# Patient Record
Sex: Female | Born: 1975
Health system: Southern US, Community
[De-identification: ages and names within clinical notes are randomized; demographics above are authoritative.]

## PROBLEM LIST (undated history)

## (undated) DIAGNOSIS — Z8659 Personal history of other mental and behavioral disorders: Secondary | ICD-10-CM

## (undated) DIAGNOSIS — K219 Gastro-esophageal reflux disease without esophagitis: Secondary | ICD-10-CM

## (undated) DIAGNOSIS — F32A Depression, unspecified: Secondary | ICD-10-CM

## (undated) DIAGNOSIS — J302 Other seasonal allergic rhinitis: Secondary | ICD-10-CM

## (undated) DIAGNOSIS — N200 Calculus of kidney: Secondary | ICD-10-CM

## (undated) DIAGNOSIS — Z8619 Personal history of other infectious and parasitic diseases: Secondary | ICD-10-CM

## (undated) HISTORY — DX: Other seasonal allergic rhinitis: J30.2

## (undated) HISTORY — DX: Gastro-esophageal reflux disease without esophagitis: K21.9

## (undated) HISTORY — DX: Calculus of kidney: N20.0

## (undated) HISTORY — DX: Depression, unspecified: F32.A

## (undated) HISTORY — DX: Personal history of other mental and behavioral disorders: Z86.59

## (undated) HISTORY — DX: Personal history of other infectious and parasitic diseases: Z86.19

---

## 1998-10-08 ENCOUNTER — Emergency Department (HOSPITAL_COMMUNITY): Admission: EM | Admit: 1998-10-08 | Discharge: 1998-10-08 | Payer: Self-pay | Admitting: Emergency Medicine

## 1999-02-12 ENCOUNTER — Emergency Department (HOSPITAL_COMMUNITY): Admission: EM | Admit: 1999-02-12 | Discharge: 1999-02-12 | Payer: Self-pay | Admitting: *Deleted

## 1999-08-30 ENCOUNTER — Other Ambulatory Visit: Admission: RE | Admit: 1999-08-30 | Discharge: 1999-08-30 | Payer: Self-pay | Admitting: Family Medicine

## 2000-05-01 ENCOUNTER — Other Ambulatory Visit: Admission: RE | Admit: 2000-05-01 | Discharge: 2000-05-01 | Payer: Self-pay | Admitting: Family Medicine

## 2000-11-27 ENCOUNTER — Other Ambulatory Visit: Admission: RE | Admit: 2000-11-27 | Discharge: 2000-11-27 | Payer: Self-pay | Admitting: Unknown Physician Specialty

## 2000-11-27 ENCOUNTER — Other Ambulatory Visit: Admission: RE | Admit: 2000-11-27 | Discharge: 2000-11-27 | Payer: Self-pay | Admitting: Family Medicine

## 2001-02-20 ENCOUNTER — Other Ambulatory Visit: Admission: RE | Admit: 2001-02-20 | Discharge: 2001-02-20 | Payer: Self-pay | Admitting: Family Medicine

## 2004-08-14 ENCOUNTER — Other Ambulatory Visit: Admission: RE | Admit: 2004-08-14 | Discharge: 2004-08-14 | Payer: Self-pay | Admitting: Family Medicine

## 2004-09-18 ENCOUNTER — Ambulatory Visit (HOSPITAL_COMMUNITY): Admission: RE | Admit: 2004-09-18 | Discharge: 2004-09-18 | Payer: Self-pay | Admitting: Family Medicine

## 2005-12-13 ENCOUNTER — Other Ambulatory Visit: Admission: RE | Admit: 2005-12-13 | Discharge: 2005-12-13 | Payer: Self-pay | Admitting: Family Medicine

## 2005-12-20 LAB — HM PAP SMEAR

## 2009-12-07 ENCOUNTER — Inpatient Hospital Stay (HOSPITAL_COMMUNITY): Admission: AD | Admit: 2009-12-07 | Discharge: 2009-12-07 | Payer: Self-pay | Admitting: Obstetrics and Gynecology

## 2010-07-10 ENCOUNTER — Inpatient Hospital Stay (HOSPITAL_COMMUNITY): Admission: AD | Admit: 2010-07-10 | Discharge: 2010-07-13 | Payer: Self-pay | Admitting: Obstetrics and Gynecology

## 2010-11-11 ENCOUNTER — Encounter: Payer: Self-pay | Admitting: Family Medicine

## 2011-01-04 LAB — CBC
HCT: 31.4 % — ABNORMAL LOW (ref 36.0–46.0)
HCT: 33.2 % — ABNORMAL LOW (ref 36.0–46.0)
Hemoglobin: 10.7 g/dL — ABNORMAL LOW (ref 12.0–15.0)
Hemoglobin: 11.3 g/dL — ABNORMAL LOW (ref 12.0–15.0)
MCH: 30.4 pg (ref 26.0–34.0)
MCH: 30.6 pg (ref 26.0–34.0)
MCHC: 34 g/dL (ref 30.0–36.0)
MCHC: 34.1 g/dL (ref 30.0–36.0)
MCV: 89.5 fL (ref 78.0–100.0)
MCV: 89.8 fL (ref 78.0–100.0)
Platelets: 175 10*3/uL (ref 150–400)
Platelets: 183 10*3/uL (ref 150–400)
RBC: 3.49 MIL/uL — ABNORMAL LOW (ref 3.87–5.11)
RBC: 3.71 MIL/uL — ABNORMAL LOW (ref 3.87–5.11)
RDW: 13.4 % (ref 11.5–15.5)
RDW: 13.7 % (ref 11.5–15.5)
WBC: 11.4 10*3/uL — ABNORMAL HIGH (ref 4.0–10.5)
WBC: 12.8 10*3/uL — ABNORMAL HIGH (ref 4.0–10.5)

## 2011-01-04 LAB — RPR: RPR Ser Ql: NONREACTIVE

## 2011-01-11 LAB — CBC
HCT: 35.3 % — ABNORMAL LOW (ref 36.0–46.0)
Hemoglobin: 12 g/dL (ref 12.0–15.0)
MCHC: 34 g/dL (ref 30.0–36.0)
MCV: 88 fL (ref 78.0–100.0)
Platelets: 238 10*3/uL (ref 150–400)
RBC: 4.01 MIL/uL (ref 3.87–5.11)
RDW: 12.3 % (ref 11.5–15.5)
WBC: 10.9 10*3/uL — ABNORMAL HIGH (ref 4.0–10.5)

## 2011-01-11 LAB — ABO/RH: ABO/RH(D): O POS

## 2011-05-01 ENCOUNTER — Encounter: Payer: Self-pay | Admitting: Family Medicine

## 2011-05-01 DIAGNOSIS — J302 Other seasonal allergic rhinitis: Secondary | ICD-10-CM | POA: Insufficient documentation

## 2011-05-01 DIAGNOSIS — F329 Major depressive disorder, single episode, unspecified: Secondary | ICD-10-CM | POA: Insufficient documentation

## 2012-10-22 HISTORY — PX: DOBUTAMINE STRESS ECHO: SHX5426

## 2013-01-07 ENCOUNTER — Observation Stay (HOSPITAL_COMMUNITY)
Admission: EM | Admit: 2013-01-07 | Discharge: 2013-01-07 | Disposition: A | Discharge status: Home / Self Care | Payer: 59 | Attending: Internal Medicine | Admitting: Internal Medicine

## 2013-01-07 ENCOUNTER — Emergency Department (HOSPITAL_COMMUNITY): Payer: 59

## 2013-01-07 ENCOUNTER — Encounter (HOSPITAL_COMMUNITY): Payer: Self-pay | Admitting: Adult Health

## 2013-01-07 DIAGNOSIS — R42 Dizziness and giddiness: Secondary | ICD-10-CM | POA: Insufficient documentation

## 2013-01-07 DIAGNOSIS — R072 Precordial pain: Secondary | ICD-10-CM

## 2013-01-07 DIAGNOSIS — E876 Hypokalemia: Secondary | ICD-10-CM | POA: Diagnosis present

## 2013-01-07 DIAGNOSIS — Z201 Contact with and (suspected) exposure to tuberculosis: Secondary | ICD-10-CM | POA: Insufficient documentation

## 2013-01-07 DIAGNOSIS — R079 Chest pain, unspecified: Principal | ICD-10-CM | POA: Insufficient documentation

## 2013-01-07 DIAGNOSIS — R0789 Other chest pain: Secondary | ICD-10-CM | POA: Diagnosis present

## 2013-01-07 DIAGNOSIS — R0602 Shortness of breath: Secondary | ICD-10-CM | POA: Insufficient documentation

## 2013-01-07 DIAGNOSIS — D72829 Elevated white blood cell count, unspecified: Secondary | ICD-10-CM | POA: Diagnosis present

## 2013-01-07 LAB — BASIC METABOLIC PANEL
BUN: 15 mg/dL (ref 6–23)
CO2: 26 mEq/L (ref 19–32)
Calcium: 9.2 mg/dL (ref 8.4–10.5)
GFR calc Af Amer: >90 mL/min (ref >90)
GFR calc non Af Amer: >90 mL/min (ref >90)
Glucose, Bld: 99 mg/dL (ref 70–99)
Potassium: 3.4 mEq/L — ABNORMAL LOW (ref 3.5–5.1)
Sodium: 138 mEq/L (ref 135–145)

## 2013-01-07 LAB — CBC WITH DIFFERENTIAL/PLATELET
Eosinophils Relative: 1 % (ref 0–5)
HCT: 37.9 % (ref 36.0–46.0)
Hemoglobin: 13 g/dL (ref 12.0–15.0)
Lymphocytes Relative: 17 % (ref 12–46)
Lymphs Abs: 2 10*3/uL (ref 0.7–4.0)
MCHC: 34.3 g/dL (ref 30.0–36.0)
MCV: 85 fL (ref 78.0–100.0)
Monocytes Absolute: 0.4 10*3/uL (ref 0.1–1.0)
Monocytes Relative: 4 % (ref 3–12)
Neutrophils Relative %: 78 % — ABNORMAL HIGH (ref 43–77)
Platelets: 209 10*3/uL (ref 150–400)
RBC: 4.46 MIL/uL (ref 3.87–5.11)
RDW: 12.3 % (ref 11.5–15.5)
WBC: 11.8 10*3/uL — ABNORMAL HIGH (ref 4.0–10.5)

## 2013-01-07 LAB — POCT I-STAT TROPONIN I
Troponin i, poc: 0 ng/mL (ref 0.00–0.08)
Troponin i, poc: 0 ng/mL (ref 0.00–0.08)

## 2013-01-07 LAB — CK TOTAL AND CKMB (NOT AT ARMC)
CK, MB: 2.3 ng/mL (ref 0.3–4.0)
Total CK: 92 U/L (ref 7–177)

## 2013-01-07 LAB — TROPONIN I: Troponin I: <0.3 ng/mL (ref <0.30)

## 2013-01-07 MED ORDER — CALCIUM CARBONATE ANTACID 500 MG PO CHEW
1.0000 | CHEWABLE_TABLET | Freq: Three times a day (TID) | ORAL | Status: DC
  Administered 2013-01-07: 200 mg via ORAL
  Filled 2013-01-07 (×3): qty 1

## 2013-01-07 MED ORDER — GI COCKTAIL ~~LOC~~
30.0000 mL | Freq: Once | ORAL | Status: AC
  Administered 2013-01-07: 30 mL via ORAL
  Filled 2013-01-07: qty 30

## 2013-01-07 MED ORDER — SODIUM CHLORIDE 0.9 % IV SOLN: INTRAVENOUS | Status: DC

## 2013-01-07 MED ORDER — POTASSIUM CHLORIDE CRYS ER 20 MEQ PO TBCR
40.0000 meq | EXTENDED_RELEASE_TABLET | Freq: Once | ORAL | Status: AC
  Administered 2013-01-07: 40 meq via ORAL
  Filled 2013-01-07: qty 2

## 2013-01-07 MED ORDER — POTASSIUM CHLORIDE 10 MEQ/100ML IV SOLN
10.0000 meq | INTRAVENOUS | Status: DC
  Filled 2013-01-07 (×2): qty 100

## 2013-01-07 MED ORDER — SODIUM CHLORIDE 0.9 % IV SOLN
INTRAVENOUS | Status: DC
  Administered 2013-01-07: 06:00:00 via INTRAVENOUS

## 2013-01-07 MED ORDER — FENTANYL CITRATE 0.05 MG/ML IJ SOLN
100.0000 ug | Freq: Once | INTRAMUSCULAR | Status: AC
  Administered 2013-01-07: 100 ug via INTRAVENOUS
  Filled 2013-01-07: qty 2

## 2013-01-07 MED ORDER — ONDANSETRON HCL 4 MG/2ML IJ SOLN: 4.0000 mg | Freq: Four times a day (QID) | INTRAMUSCULAR | Status: DC | PRN

## 2013-01-07 MED ORDER — IOHEXOL 350 MG/ML SOLN
100.0000 mL | Freq: Once | INTRAVENOUS | Status: AC | PRN
  Administered 2013-01-07: 85 mL via INTRAVENOUS

## 2013-01-07 MED ORDER — ALUM & MAG HYDROXIDE-SIMETH 200-200-20 MG/5ML PO SUSP: 30.0000 mL | Freq: Four times a day (QID) | ORAL | Status: DC | PRN

## 2013-01-07 MED ORDER — ACETAMINOPHEN 650 MG RE SUPP: 650.0000 mg | Freq: Four times a day (QID) | RECTAL | Status: DC | PRN

## 2013-01-07 MED ORDER — ONDANSETRON HCL 4 MG/2ML IJ SOLN
4.0000 mg | Freq: Once | INTRAMUSCULAR | Status: AC
  Administered 2013-01-07: 4 mg via INTRAVENOUS
  Filled 2013-01-07: qty 2

## 2013-01-07 MED ORDER — ACETAMINOPHEN 325 MG PO TABS: 650.0000 mg | ORAL_TABLET | Freq: Four times a day (QID) | ORAL | Status: DC | PRN

## 2013-01-07 MED ORDER — PANTOPRAZOLE SODIUM 40 MG PO TBEC: 40.0000 mg | DELAYED_RELEASE_TABLET | Freq: Every day | ORAL | Status: DC

## 2013-01-07 MED ORDER — PANTOPRAZOLE SODIUM 40 MG IV SOLR
40.0000 mg | Freq: Once | INTRAVENOUS | Status: AC
  Administered 2013-01-07: 40 mg via INTRAVENOUS
  Filled 2013-01-07: qty 40

## 2013-01-07 MED ORDER — PANTOPRAZOLE SODIUM 40 MG IV SOLR
40.0000 mg | Freq: Two times a day (BID) | INTRAVENOUS | Status: DC
  Administered 2013-01-07: 40 mg via INTRAVENOUS
  Filled 2013-01-07 (×2): qty 40

## 2013-01-07 MED ORDER — HYDROCODONE-ACETAMINOPHEN 5-325 MG PO TABS: 1.0000 | ORAL_TABLET | ORAL | Status: DC | PRN

## 2013-01-07 MED ORDER — HYDROMORPHONE HCL PF 1 MG/ML IJ SOLN: 1.0000 mg | INTRAMUSCULAR | Status: DC | PRN

## 2013-01-07 MED ORDER — SODIUM CHLORIDE 0.9 % IJ SOLN: 3.0000 mL | Freq: Two times a day (BID) | INTRAMUSCULAR | Status: DC

## 2013-01-07 MED ORDER — TRAMADOL HCL 50 MG PO TABS: 50.0000 mg | ORAL_TABLET | Freq: Four times a day (QID) | ORAL | Status: DC | PRN

## 2013-01-07 MED ORDER — ONDANSETRON HCL 4 MG PO TABS: 4.0000 mg | ORAL_TABLET | Freq: Four times a day (QID) | ORAL | Status: DC | PRN

## 2013-01-07 NOTE — ED Provider Notes (Signed)
History     CSN: [id number]  Arrival date & time 01/07/13  0150   First MD Initiated Contact with Patient 01/07/13 0231      Chief Complaint  Patient presents with  . Chest Pain    (Consider location/radiation/quality/duration/timing/severity/associated sxs/prior treatment) HPI This is a 37 year old female without significant past medical history. She had the sudden onset of chest pain about 11 PM yesterday evening. The pain is described as crushing and severe. It is located all across her chest without focality or radiation. The pain is not worse with deep breaths. She is not short of breath but is hesitant to take a breath because of the presence of the pain. She took antacids with no relief. The pain is not worse with movement. She has some nausea but no vomiting. She denies abdominal pain. She denies leg pain or swelling. She denies recent prolonged immobilization. She is on birth control pills.  Past Medical History  Diagnosis Date  . Depression   . Seasonal allergies     History reviewed. No pertinent past surgical history.  History reviewed. No pertinent family history.  History  Substance Use Topics  . Smoking status: Not on file  . Smokeless tobacco: Not on file  . Alcohol Use: Not on file    OB History   Grav Para Term Preterm Abortions TAB SAB Ect Mult Living                  Review of Systems  All other systems reviewed and are negative.    Allergies  Review of patient's allergies indicates no known allergies.  Home Medications   Current Outpatient Rx  Name  Route  Sig  Dispense  Refill  . calcium carbonate (TUMS - DOSED IN MG ELEMENTAL CALCIUM) 500 MG chewable tablet   Oral   Chew 1 tablet by mouth 3 (three) times daily.         Marland Kitchen ibuprofen (ADVIL,MOTRIN) 200 MG tablet   Oral   Take 200 mg by mouth every 6 (six) hours as needed for pain.           BP 98/63  Pulse 69  Temp(Src) 97.8 F (36.6 C) (Oral)  Resp 11  SpO2 97%  Physical  Exam General: Well-developed, well-nourished female in no acute distress; appearance consistent with age of record HENT: normocephalic, atraumatic Eyes: pupils equal round and reactive to light; extraocular muscles intact Neck: supple Heart: regular rate and rhythm; no murmurs, rubs or gallops Lungs: clear to auscultation bilaterally Abdomen: soft; nondistended; nontender; no masses or hepatosplenomegaly; bowel sounds present Extremities: No deformity; full range of motion; pulses normal; no calf tenderness; no edema Neurologic: Awake, alert and oriented; motor function intact in all extremities and symmetric; no facial droop Skin: Warm and dry Psychiatric: Flat affect    ED Course  Procedures (including critical care time)     MDM   Nursing notes and vitals signs, including pulse oximetry, reviewed.  Summary of this visit's results, reviewed by myself:  Labs:  Results for orders placed during the hospital encounter of 01/07/13 (from the past 24 hour(s))  CBC WITH DIFFERENTIAL     Status: Abnormal   Collection Time    01/07/13  2:01 AM      Result Value Range   WBC 11.8 (*) 4.0 - 10.5 K/uL   RBC 4.46  3.87 - 5.11 MIL/uL   Hemoglobin 13.0  12.0 - 15.0 g/dL   HCT 40.9  81.1 - 91.4 %  MCV 85.0  78.0 - 100.0 fL   MCH 29.1  26.0 - 34.0 pg   MCHC 34.3  30.0 - 36.0 g/dL   RDW 16.1  09.6 - 04.5 %   Platelets 209  150 - 400 K/uL   Neutrophils Relative 78 (*) 43 - 77 %   Neutro Abs 9.2 (*) 1.7 - 7.7 K/uL   Lymphocytes Relative 17  12 - 46 %   Lymphs Abs 2.0  0.7 - 4.0 K/uL   Monocytes Relative 4  3 - 12 %   Monocytes Absolute 0.4  0.1 - 1.0 K/uL   Eosinophils Relative 1  0 - 5 %   Eosinophils Absolute 0.2  0.0 - 0.7 K/uL   Basophils Relative 0  0 - 1 %   Basophils Absolute 0.0  0.0 - 0.1 K/uL  BASIC METABOLIC PANEL     Status: Abnormal   Collection Time    01/07/13  2:01 AM      Result Value Range   Sodium 138  135 - 145 mEq/L   Potassium 3.4 (*) 3.5 - 5.1 mEq/L    Chloride 103  96 - 112 mEq/L   CO2 26  19 - 32 mEq/L   Glucose, Bld 99  70 - 99 mg/dL   BUN 15  6 - 23 mg/dL   Creatinine, Ser 4.09  0.50 - 1.10 mg/dL   Calcium 9.2  8.4 - 81.1 mg/dL   GFR calc non Af Amer >90  >90 mL/min   GFR calc Af Amer >90  >90 mL/min  POCT I-STAT TROPONIN I     Status: None   Collection Time    01/07/13  2:15 AM      Result Value Range   Troponin i, poc 0.00  0.00 - 0.08 ng/mL   Comment 3           POCT I-STAT TROPONIN I     Status: None   Collection Time    01/07/13  5:32 AM      Result Value Range   Troponin i, poc 0.00  0.00 - 0.08 ng/mL   Comment 3             Imaging Studies: Dg Chest 2 View  01/07/2013  *RADIOLOGY REPORT*  Clinical Data: Sudden onset generalized chest pain and dizziness.  CHEST - 2 VIEW  Comparison: None.  Findings: The heart size and pulmonary vascularity are normal. The lungs appear clear and expanded without focal air space disease or consolidation. No blunting of the costophrenic angles.  No pneumothorax.  Mediastinal contours appear intact.  IMPRESSION: No evidence of active pulmonary disease.   Original Report Authenticated By: Burman Nieves, M.D.    Ct Angio Chest Pe W/cm &/or Wo Cm  01/07/2013  *RADIOLOGY REPORT*  Clinical Data: Crushing bilateral upper chest pain.  Shortness of breath.  History of TB exposure with and 2 months.  CT ANGIOGRAPHY CHEST  Technique:  Multidetector CT imaging of the chest using the standard protocol during bolus administration of intravenous contrast. Multiplanar reconstructed images including MIPs were obtained and reviewed to evaluate the vascular anatomy.  Contrast: 85mL OMNIPAQUE IOHEXOL 350 MG/ML SOLN  Comparison: None.  Findings: Technically adequate study with good opacification of the central and segmental pulmonary arteries.  No focal filling defects.  No evidence of significant pulmonary embolus.  Normal heart size.  Normal caliber thoracic aorta.  No significant lymphadenopathy in the chest.   The esophagus is decompressed.  No abnormal mediastinal  fluid collections.  The lungs appear clear expanded except for minimal dependent changes.  No focal consolidation or airspace disease.  No significant interstitial change.  No parenchymal changes that would suggest active TB. Airways appear patent.  No pleural effusions.  No pneumothorax.  Visualized portions of the upper abdominal organs are grossly unremarkable.  Normal alignment of the thoracic vertebrae.  IMPRESSION: No evidence of significant pulmonary embolus.  No evidence of active pulmonary disease.   Original Report Authenticated By: Burman Nieves, M.D.      Date: 01/07/2013 1:55 AM  Rate: 69  Rhythm: normal sinus rhythm  QRS Axis: normal  Intervals: normal  ST/T Wave abnormalities: normal  Conduction Disutrbances: none  Narrative Interpretation: unremarkable  Comparison with previous EKG: None available  3:25 AM Complete relief with IV Zofran and fentanyl.  5:16 AM Patient's pain is returned. Not relieved by GI cocktail. Pain improved before an EKG could be obtained. An EKG was obtained pain was nearly gone; the EKG was normal and unchanged from prior.  6:52 AM Patient asymptomatic at this time but has had several episodes. This is concerning for Prinzmetal's angina and will have her admitted for further evaluation.          Hanley Seamen, MD 01/07/13 [phone]

## 2013-01-07 NOTE — Progress Notes (Addendum)
Spoke with Sarah Mcdaniel in echo lab. Pt scheduled for stress echo at 3pm today. Pt may have full liquid diet (NO SOLIDS) until 11am. [name], RN  Verified with Dr. Eldridge Dace that patient can have liquid breakfast. Keep NPO at 11am. [name], RN

## 2013-01-07 NOTE — ED Notes (Signed)
Pt ambulated to restroom. 

## 2013-01-07 NOTE — Progress Notes (Signed)
  Echocardiogram Echocardiogram Stress Test has been performed.  Cathie Beams 01/07/2013, 4:46 PM

## 2013-01-07 NOTE — ED Notes (Signed)
Pt placed back on cardiac monitor.

## 2013-01-07 NOTE — H&P (Signed)
History and Physical       Hospital Admission Note Date: 01/07/2013  Patient name: Sarah Mcdaniel Medical record number: [id number] Date of birth: 06/28/1976 Age: 37 y.o. 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 History was obtained from the patient who stated that she was sitting up and watching TV, when she developed sudden crushing and severe chest pain, diffusely all over the chest. There was no radiation or focality, no palpitations or diaphoresis. Patient took TUMS but no relief. No worsening of the chest pain with deep breathing or movement. She did have some nausea but no vomiting or any abdominal pain. She denies any history of GERD. She has been taking NSAIDs PRN for headaches but not regularly. She denies any long-distance flights or prolonged immobilization. She is on birth control pills.  Patient also reports that her father had similar episodes of chest pain but did not have a diagnosis, she has several family members with "heart disease". EKG showed normal sinus rhythm with no acute ST-T wave changes, first set of troponins negative. CT angiogram of the chest was done in ED and showed no acute pulmonary embolus or any active pulmonary disease, normal caliber thoracic aorta. At the time of my encounter, chest pain had improved with fentanyl.  Review of Systems:  Constitutional: Denies fever, chills, diaphoresis, appetite change and fatigue.  HEENT: Denies photophobia, eye pain, redness, hearing loss, ear pain, congestion, sore throat, rhinorrhea, sneezing, mouth sores, trouble swallowing, neck pain, neck stiffness and tinnitus.   Respiratory: Denies SOB, DOE, cough,  and wheezing.   Cardiovascular: See history of present  illness Gastrointestinal: Denies vomiting, abdominal pain, diarrhea, constipation, blood in stool and abdominal distention.  Genitourinary: Denies dysuria, urgency, frequency, hematuria, flank pain and difficulty urinating.  Musculoskeletal: Denies myalgias, back pain, joint swelling, arthralgias and gait problem.  Skin: Denies pallor, rash and wound.  Neurological: Denies dizziness, seizures, syncope, weakness, light-headedness, numbness and headaches.  Hematological: Denies adenopathy. Easy bruising, personal or family bleeding history  Psychiatric/Behavioral: Denies suicidal ideation, mood changes, confusion, nervousness, sleep disturbance and agitation  Past Medical History: Past Medical History  Diagnosis Date  . Depression   . Seasonal allergies    History reviewed. No pertinent past surgical history.  Medications: Prior to Admission medications   Medication Sig Start Date End Date Taking? Authorizing Provider  calcium carbonate (TUMS - DOSED IN MG ELEMENTAL CALCIUM) 500 MG chewable tablet Chew 1 tablet by mouth 3 (three) times daily.   Yes Historical Provider, MD  ibuprofen (ADVIL,MOTRIN) 200 MG tablet Take 200 mg by mouth every 6 (six) hours as needed for pain.   Yes Historical Provider, MD    Allergies:  No Known Allergies  Social History: Denies any tobacco, and drugs. Drinks alcohol occasionally.  Family History: States several family members have "heart disease"  Physical Exam: Blood pressure 96/60, pulse 76, temperature 98.4 F (36.9 C), temperature source Oral, resp. rate 14, SpO2 98.00%. General: Alert, awake, oriented x3, in no acute distress. HEENT: normocephalic, atraumatic, anicteric sclera, pink conjunctiva, pupils equal and reactive to light and accomodation, oropharynx clear Neck: supple, no masses or lymphadenopathy, no goiter, no bruits  Heart: Regular rate and rhythm, without murmurs, rubs or gallops. Lungs: Clear to auscultation bilaterally, no  wheezing, rales or rhonchi. Mild chest  wall tenderness (reports as 'sore') Abdomen: Soft, nontender, nondistended, positive bowel sounds, no masses. Extremities: No clubbing, cyanosis or edema with positive pedal pulses. Neuro: Grossly intact, no focal neurological deficits, strength 5/5 upper and lower extremities bilaterally Psych: alert and oriented x 3, normal mood and affect Skin: no rashes or lesions, warm and dry   LABS on Admission:  Basic Metabolic Panel:  Recent Labs Lab 01/07/13 0201  NA 138  K 3.4*  CL 103  CO2 26  GLUCOSE 99  BUN 15  CREATININE 0.65  CALCIUM 9.2     Recent Labs Lab 01/07/13 0201  WBC 11.8*  NEUTROABS 9.2*  HGB 13.0  HCT 37.9  MCV 85.0  PLT 209     Radiological Exams on Admission: Dg Chest 2 View  01/07/2013  *RADIOLOGY REPORT*  Clinical Data: Sudden onset generalized chest pain and dizziness.  CHEST - 2 VIEW  Comparison: None.  Findings: The heart size and pulmonary vascularity are normal. The lungs appear clear and expanded without focal air space disease or consolidation. No blunting of the costophrenic angles.  No pneumothorax.  Mediastinal contours appear intact.  IMPRESSION: No evidence of active pulmonary disease.   Original Report Authenticated By: Burman Nieves, M.D.    Ct Angio Chest Pe W/cm &/or Wo Cm  01/07/2013  *RADIOLOGY REPORT*  Clinical Data: Crushing bilateral upper chest pain.  Shortness of breath.  History of TB exposure with and 2 months.  CT ANGIOGRAPHY CHEST  Technique:  Multidetector CT imaging of the chest using the standard protocol during bolus administration of intravenous contrast. Multiplanar reconstructed images including MIPs were obtained and reviewed to evaluate the vascular anatomy.  Contrast: 85mL OMNIPAQUE IOHEXOL 350 MG/ML SOLN  Comparison: None.  Findings: Technically adequate study with good opacification of the central and segmental pulmonary arteries.  No focal filling defects.  No evidence of  significant pulmonary embolus.  Normal heart size.  Normal caliber thoracic aorta.  No significant lymphadenopathy in the chest.  The esophagus is decompressed.  No abnormal mediastinal fluid collections.  The lungs appear clear expanded except for minimal dependent changes.  No focal consolidation or airspace disease.  No significant interstitial change.  No parenchymal changes that would suggest active TB. Airways appear patent.  No pleural effusions.  No pneumothorax.  Visualized portions of the upper abdominal organs are grossly unremarkable.  Normal alignment of the thoracic vertebrae.  IMPRESSION: No evidence of significant pulmonary embolus.  No evidence of active pulmonary disease.   Original Report Authenticated By: Burman Nieves, M.D.     Assessment/Plan Principal Problem:  Chest pain, midsternal: Unclear etiology. CT angiogram of the chest did not show any PE or active pulmonary disease, normal caliber thoracic aorta without any dissection. Patient does not give classical history of GERD however she has been taking NSAID's PRN. Vague family history of "heart disease", no trauma. EKG showed no acute ST-T changes suggestive of ischemia. Chest pain currently resolved. - Admit for observation, serial cardiac enzymes  - NPO, discussed with [facility], Dr Eldridge Dace, recommended Stress ECHO for furtehr work-up  - continue IV PPI   Hypokalemia: replace IV as patient is NPO  Leukocytosis: Mild, unspecified, no signs of any infection, likely stress demargination, chest Xray neg for any PNA.  DVT prophylaxis: SCD's  CODE STATUS: Full Code  Further plan will depend as patient's clinical course evolves and further radiologic and laboratory data become available.   Time Spent on Admission: 1 hour  Muad Noga M.D. [facility]  01/07/2013, 7:59 AM Pager: [phone]  If 7PM-7AM, please contact night-coverage [email or web address] Password [email or web address]

## 2013-01-07 NOTE — Progress Notes (Signed)
Pt provided with dc instructions and education. Pt verbalized understanding. Pt handed prescriptions and verbalized understanding of how to take meds. No questions at this time. IV removed with tip intact. Heart monitor cleaned and returned to frotn. [name], RN

## 2013-01-07 NOTE — ED Notes (Signed)
Pt transported to CT ?

## 2013-01-07 NOTE — ED Notes (Signed)
Pt called out to nurse stating she was having chest pain again. MD Molpus at the bedside.

## 2013-01-07 NOTE — Discharge Summary (Signed)
Physician Discharge Summary  Patient ID: Sarah Mcdaniel MRN: [id number] DOB/AGE: 36-09-1976 36 y.o.  Admit date: 01/07/2013 Discharge date: 01/07/2013  Primary Care Physician:  Leo Grosser, MD  Discharge Diagnoses:    . Chest pain, midsternal- cardiac cause ruled out  . Hypokalemia . Leukocytosis, unspecified  Consults: Cardiology, Dr. Eldridge Dace  Discharge Instructions:   Discharge Medications:   Medication List    STOP taking these medications       ibuprofen 200 MG tablet  Commonly known as:  ADVIL,MOTRIN      TAKE these medications       calcium carbonate 500 MG chewable tablet  Commonly known as:  TUMS - dosed in mg elemental calcium  Chew 1 tablet by mouth 3 (three) times daily.     pantoprazole 40 MG tablet  Commonly known as:  PROTONIX  Take 1 tablet (40 mg total) by mouth daily.     traMADol 50 MG tablet  Commonly known as:  ULTRAM  Take 1 tablet (50 mg total) by mouth every 6 (six) hours as needed for pain.         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 History was obtained from the patient who stated that she was sitting up and watching TV, when she developed sudden crushing and severe chest pain, diffusely all over the chest. There was no radiation or focality, no palpitations or diaphoresis. Patient took TUMS but no relief. No worsening of the chest pain with deep breathing or movement. She did have some nausea but no vomiting or any abdominal pain. She denies any history of GERD. She has been taking NSAIDs PRN for headaches but not regularly. She denies any long-distance flights or prolonged immobilization. She is on birth control pills.  Patient also reports that her father had similar episodes of chest pain but did not have a diagnosis, she has  several family members with "heart disease".  EKG showed normal sinus rhythm with no acute ST-T wave changes, first set of troponins negative. CT angiogram of the chest was done in ED and showed no acute pulmonary embolus or any active pulmonary disease, normal caliber thoracic aorta.     Hospital Course:  Chest pain, midsternal: Unclear etiology but cardiac etiology ruled out. CT angiogram of the chest did not show any PE or active pulmonary disease, normal caliber thoracic aorta without any dissection. Patient does not give classical history of GERD however she has been taking NSAID's PRN for headaches. Father has CAD. EKG showed no acute ST-T changes suggestive of ischemia. Chest pain was resolved at the time of admission. Cardiology consultation was obtained and patient was seen by Dr Eldridge Dace. She underwent stress echocardiogram which showed no echocardiographic evidence for stress-induced ischemia. No stress arrhythmias or conduction abnormalities were seen on the stress ECG. Patient was placed on PPI and given a prescription for Protonix and tramadol. I offered her GI evaluation and she wanted further evaluation outpatient.  Leukocytosis: Mild, unspecified, no signs of any infection, likely stress demargination, chest Xray neg for any PNA.   Day of Discharge BP 137/73  Pulse 79  Temp(Src) 98.9 F (37.2 C) (Oral)  Resp 16  Ht 5\' 8"  (1.727 m)  Wt 72.031 kg (158 lb 12.8 oz)  BMI 24.15 kg/m2  SpO2 96%  Physical Exam: General: Alert and awake oriented x3 not in any acute distress. HEENT: anicteric sclera, pupils reactive to light and accommodation CVS: S1-S2 clear no murmur rubs or gallops Chest: clear to auscultation bilaterally, no wheezing rales or rhonchi Abdomen: soft nontender, nondistended, normal bowel sounds, no organomegaly Extremities: no cyanosis, clubbing or edema noted bilaterally Neuro: Cranial nerves II-XII intact, no focal neurological deficits   The results of  significant diagnostics from this hospitalization (including imaging, microbiology, ancillary and laboratory) are listed below for reference.    LAB RESULTS: Basic Metabolic Panel:  Recent Labs Lab 01/07/13 0201  NA 138  K 3.4*  CL 103  CO2 26  GLUCOSE 99  BUN 15  CREATININE 0.65  CALCIUM 9.2   CBC:  Recent Labs Lab 01/07/13 0201  WBC 11.8*  NEUTROABS 9.2*  HGB 13.0  HCT 37.9  MCV 85.0  PLT 209   Cardiac Enzymes:  Recent Labs Lab 01/07/13 0933  CKTOTAL 92  CKMB 2.3  TROPONINI <0.30    Significant Diagnostic Studies:  Dg Chest 2 View  01/07/2013  *RADIOLOGY REPORT*  Clinical Data: Sudden onset generalized chest pain and dizziness.  CHEST - 2 VIEW  Comparison: None.  Findings: The heart size and pulmonary vascularity are normal. The lungs appear clear and expanded without focal air space disease or consolidation. No blunting of the costophrenic angles.  No pneumothorax.  Mediastinal contours appear intact.  IMPRESSION: No evidence of active pulmonary disease.   Original Report Authenticated By: Burman Nieves, M.D.    Ct Angio Chest Pe W/cm &/or Wo Cm  01/07/2013  *RADIOLOGY REPORT*  Clinical Data: Crushing bilateral upper chest pain.  Shortness of breath.  History of TB exposure with and 2 months.  CT ANGIOGRAPHY CHEST  Technique:  Multidetector CT imaging of the chest using the standard protocol during bolus administration of intravenous contrast. Multiplanar reconstructed images including MIPs were obtained and reviewed to evaluate the vascular anatomy.  Contrast: 85mL OMNIPAQUE IOHEXOL 350 MG/ML SOLN  Comparison: None.  Findings: Technically adequate study with good opacification of the central and segmental pulmonary arteries.  No focal filling defects.  No evidence of significant pulmonary embolus.  Normal heart size.  Normal caliber thoracic aorta.  No significant lymphadenopathy in the chest.  The esophagus is decompressed.  No abnormal mediastinal fluid  collections.  The lungs appear clear expanded except for minimal dependent changes.  No focal consolidation or airspace disease.  No significant interstitial change.  No parenchymal changes that would suggest active TB. Airways appear patent.  No pleural effusions.  No pneumothorax.  Visualized portions of the upper abdominal organs are grossly unremarkable.  Normal alignment of the thoracic vertebrae.  IMPRESSION: No evidence of significant pulmonary embolus.  No evidence of active pulmonary disease.   Original Report Authenticated By: Burman Nieves, M.D.     Echocardiogram stress test 01/07/13: Study Conclusions - Stress ECG conclusions: There were no stress arrhythmias or conduction abnormalities. The stress ECG was negative for ischemia. - Staged echo: There was no echocardiographic evidence for stress-induced ischemia. Stress echo results: Left ventricular ejection fraction was normal at rest and with stress. There was no echocardiographic evidence for stress-induced ischemia.     DISPOSITION: Home DIET: Heart healthy diet ACTIVITY: As tolerated   DISCHARGE FOLLOW-UP     Follow-up Information   Follow up with Leo Grosser, MD.   Contact information:   [address] [place] [address] [phone]       Time  spent on Discharge: 30 mins  Signed:   RAI,RIPUDEEP M.D. [facility] 01/07/2013, 8:20 PM Pager: [phone]

## 2013-01-07 NOTE — Consult Note (Signed)
Admit date: 01/07/2013 Referring Physician Dr. 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  She presented with crushing chest pain which started around 10 PM last night. She waited at home for a few hours without relief and cam eto the ER at 1 AM.  History was obtained from the patient who stated that she was sitting up and watching TV, when she developed sudden crushing and severe chest pain, unlike any prior chest pain.   Father had CAD.  CT was negative for PE.  Pain is better.  Troponin has been better.    Negative treadmill test a few years ago at PMD.     PMH:    Past Medical History  Diagnosis Date  . Depression   . Seasonal allergies     PSH:   History reviewed. No pertinent past surgical history.  ALLERGIES:   Review of patient's allergies indicates no known allergies.  Prior to Admit Meds:   Prescriptions prior to admission  Medication Sig Dispense Refill  . calcium carbonate (TUMS - DOSED IN MG ELEMENTAL CALCIUM) 500 MG chewable tablet Chew 1 tablet by mouth 3 (three) times daily.      . [DISCONTINUED] ibuprofen (ADVIL,MOTRIN) 200 MG tablet Take 200 mg by mouth every 6 (six) hours as needed for pain.       Family HX:   Father with CAD Social HX:    History   Social History  . Marital Status: Married    Spouse Name: N/A    Number of Children: N/A  . Years of Education: N/A   Occupational History  . Not on file.   Social History Main Topics  . Smoking status: Not on file  . Smokeless tobacco: Not on file  . Alcohol Use: Not on file  . Drug Use: Not on file  . Sexually Active: Not on file   Other Topics Concern  . Not on file   Social History Narrative  . No narrative on file   Former smoker  ROS:  All 11 ROS were addressed and are negative except what is stated in the HPI  PHYSICAL EXAM Filed Vitals:   01/07/13 1400  BP:  137/73  Pulse: 79  Temp: 98.9 F (37.2 C)  Resp: 16   General: Well developed, well nourished, in no acute distress Head:  Normal cephalic and atramatic  Lungs:   Clear bilaterally to auscultation and percussion. Heart:   HRRR S1 S2  Abdomen: nondistended Msk:  Back normal, normal gait. Normal strength and tone for age. Extremities:   No  edema.   Neuro: Alert and oriented X 3. Psych:  Normal affect, responds appropriately   Labs:   Lab Results  Component Value Date   WBC 11.8* 01/07/2013   HGB 13.0 01/07/2013   HCT 37.9 01/07/2013   MCV 85.0 01/07/2013   PLT 209 01/07/2013    Recent Labs Lab 01/07/13 0201  NA 138  K 3.4*  CL 103  CO2 26  BUN 15  CREATININE 0.65  CALCIUM 9.2  GLUCOSE 99   Lab Results  Component Value Date   CKTOTAL 92 01/07/2013   CKMB 2.3 01/07/2013   TROPONINI <0.30 01/07/2013   No results found for this basename: PTT   No results found for this basename: INR, PROTIME     No results found for this basename: CHOL   No results found for  this basename: HDL   No results found for this basename: LDLCALC   No results found for this basename: TRIG   No results found for this basename: CHOLHDL   No results found for this basename: LDLDIRECT      Radiology:  @[id number] @  EKG:  Normal ASSESSMENT: Atypical chest pain  PLAN:  Stress test is indicated.  Discussed options with Dr. Isidoro Donning.  Would  Prefer stress echo to avoid the radiation associated with nuclear stress test in this young patient.  Risk factor modification.  She should exercise regularly which she has not been doing of late.  Corky Crafts., MD  01/07/2013  5:31 PM

## 2013-01-07 NOTE — ED Notes (Signed)
Presents with crushing chest pain that began at 11 pm this evening while sitting and watching T.V, pain is constant.  denies nausea, c/o SOB and difficulty talking a deep breath, denies dizziness, denies radiation. Nothing makes pain better and nothing makes pain worse, tried antacids with no relief. Pt is clutching chest and rates pain 10/10.

## 2013-01-07 NOTE — Progress Notes (Signed)
Utilization review completed.  

## 2013-01-07 NOTE — ED Notes (Signed)
Pt returned from CT, placed back on cardiac monitor

## 2013-12-06 ENCOUNTER — Encounter: Payer: Self-pay | Admitting: *Deleted

## 2014-11-03 ENCOUNTER — Ambulatory Visit (INDEPENDENT_AMBULATORY_CARE_PROVIDER_SITE_OTHER): Payer: 59 | Admitting: Family Medicine

## 2014-11-03 ENCOUNTER — Encounter: Payer: Self-pay | Admitting: Family Medicine

## 2014-11-03 VITALS — BP 108/62 | HR 80 | Temp 98.2°F | Ht 66.0 in | Wt 164.5 lb

## 2014-11-03 DIAGNOSIS — Z0000 Encounter for general adult medical examination without abnormal findings: Secondary | ICD-10-CM

## 2014-11-03 NOTE — Patient Instructions (Signed)
Return Friday for fasting labs. Check on immunizations. Good to meet you today, call us with quesitons. Return as needed or in 2-3 years for next physical.

## 2014-11-03 NOTE — Progress Notes (Addendum)
BP 108/62 mmHg  Pulse 80  Temp(Src) 98.2 F (36.8 C) (Oral)  Ht 5\' 6"  (1.676 m)  Wt 164 lb 8 oz (74.617 kg)  BMI 26.56 kg/m2   CC: new pt to establish care, would like CPE  Subjective:    Patient ID: Sarah Mcdaniel, female    DOB: 06-25-1976, 39 y.o.   MRN: [id number]  HPI: Sarah Mcdaniel is a 39 y.o. female presenting on 11/03/2014 for Establish Care   No recent PCP.   Preventative: Well woman upcoming - Dr Corinna Capra at [facility]. 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 Interim medical history since our last visit reviewed. Allergies and medications reviewed and updated. No current outpatient prescriptions on file prior to visit.   No current facility-administered medications on file prior to visit.    Review of Systems  Constitutional: Negative for fever, chills, activity change, appetite change, fatigue and unexpected weight change.  HENT: Negative for hearing loss.   Eyes: Negative for visual disturbance.  Respiratory: Negative for cough, chest tightness, shortness of breath and wheezing.   Cardiovascular: Negative for chest pain, palpitations and leg swelling.  Gastrointestinal: Negative for nausea, vomiting, abdominal pain, diarrhea, constipation, blood in stool and abdominal distention.  Genitourinary: Negative for hematuria and difficulty urinating.  Musculoskeletal: Negative for myalgias, arthralgias and neck pain.  Skin: Negative for rash.  Neurological: Negative for dizziness, seizures, syncope and headaches.  Hematological: Negative for adenopathy. Does not bruise/bleed easily.    Psychiatric/Behavioral: Negative for dysphoric mood. The patient is not nervous/anxious.    Per HPI unless specifically indicated above     Objective:    BP 108/62 mmHg  Pulse 80  Temp(Src) 98.2 F (36.8 C) (Oral)  Ht 5\' 6"  (1.676 m)  Wt 164 lb 8 oz (74.617 kg)  BMI 26.56 kg/m2  Wt Readings from Last 3 Encounters:  11/03/14 164 lb 8 oz (74.617 kg)  01/07/13 158 lb 12.8 oz (72.031 kg)    Physical Exam  Constitutional: She is oriented to person, place, and time. She appears well-developed and well-nourished. No distress.  HENT:  Head: Normocephalic and atraumatic.  Right Ear: Hearing, tympanic membrane, external ear and ear canal normal.  Left Ear: Hearing, tympanic membrane, external ear and ear canal normal.  Nose: Nose normal.  Mouth/Throat: Uvula is midline, oropharynx is clear and moist and mucous membranes are normal. No oropharyngeal exudate, posterior oropharyngeal edema or posterior oropharyngeal erythema.  Eyes: Conjunctivae and EOM are normal. Pupils are equal, round, and reactive to light. No scleral icterus.  Neck: Normal range of motion. Neck supple. No thyromegaly present.  Cardiovascular: Normal rate, regular rhythm, normal heart sounds and intact distal pulses.   No murmur heard. Pulses:      Radial pulses are 2+ on the right side, and 2+ on the left side.  Pulmonary/Chest: Effort normal and breath sounds normal. No respiratory distress. She has no wheezes. She has no rales.  Abdominal: Soft. Bowel sounds are normal. She exhibits no distension and no mass. There is no tenderness. There is no rebound and no guarding.  Musculoskeletal: Normal range of motion. She exhibits no edema.  Lymphadenopathy:    She has no cervical adenopathy.  Neurological: She is alert and oriented to person, place, and time.  CN grossly intact, station and gait intact  Skin: Skin is warm and dry. No rash noted.  Psychiatric: She has a normal mood and affect. Her behavior is normal.  Judgment and thought content normal.  Nursing note and vitals reviewed.      Assessment & Plan:   Problem List Items Addressed This Visit    Health maintenance examination - Primary    Preventative protocols reviewed and updated unless pt declined. Discussed healthy diet and lifestyle.  Encouraged continued healthy diet/lifestyle efforts.      Relevant Orders   Lipid panel   TSH   CBC with Differential   Basic metabolic panel       Follow up plan: Return as needed, for physical.

## 2014-11-03 NOTE — Progress Notes (Signed)
Pre visit review using our clinic review tool, if applicable. No additional management support is needed unless otherwise documented below in the visit note. 

## 2014-11-03 NOTE — Addendum Note (Signed)
Addended by: Ria Bush on: 11/03/2014 10:03 AM   Modules accepted: Orders

## 2014-11-03 NOTE — Assessment & Plan Note (Addendum)
Preventative protocols reviewed and updated unless pt declined. Discussed healthy diet and lifestyle.  Encouraged continued healthy diet/lifestyle efforts.

## 2014-11-05 ENCOUNTER — Other Ambulatory Visit (INDEPENDENT_AMBULATORY_CARE_PROVIDER_SITE_OTHER): Payer: 59

## 2014-11-05 DIAGNOSIS — Z0000 Encounter for general adult medical examination without abnormal findings: Secondary | ICD-10-CM

## 2014-11-05 LAB — CBC WITH DIFFERENTIAL/PLATELET
BASOS PCT: 0.7 % (ref 0.0–3.0)
Basophils Absolute: 0 10*3/uL (ref 0.0–0.1)
EOS ABS: 0.2 10*3/uL (ref 0.0–0.7)
Eosinophils Relative: 3.2 % (ref 0.0–5.0)
HEMATOCRIT: 39.4 % (ref 36.0–46.0)
HEMOGLOBIN: 12.8 g/dL (ref 12.0–15.0)
LYMPHS ABS: 1.7 10*3/uL (ref 0.7–4.0)
LYMPHS PCT: 25.5 % (ref 12.0–46.0)
MCHC: 32.6 g/dL (ref 30.0–36.0)
MCV: 86.8 fl (ref 78.0–100.0)
MONO ABS: 0.7 10*3/uL (ref 0.1–1.0)
MONOS PCT: 9.8 % (ref 3.0–12.0)
NEUTROS ABS: 4.1 10*3/uL (ref 1.4–7.7)
Neutrophils Relative %: 60.8 % (ref 43.0–77.0)
Platelets: 235 10*3/uL (ref 150.0–400.0)
RBC: 4.54 Mil/uL (ref 3.87–5.11)
RDW: 13 % (ref 11.5–15.5)
WBC: 6.7 10*3/uL (ref 4.0–10.5)

## 2014-11-05 LAB — BASIC METABOLIC PANEL
BUN: 10 mg/dL (ref 6–23)
CO2: 26 meq/L (ref 19–32)
Calcium: 9.1 mg/dL (ref 8.4–10.5)
Chloride: 108 mEq/L (ref 96–112)
Creatinine, Ser: 0.56 mg/dL (ref 0.40–1.20)
GFR: 129.23 mL/min (ref >60.00)
GLUCOSE: 72 mg/dL (ref 70–99)
Potassium: 3.6 mEq/L (ref 3.5–5.1)
SODIUM: 140 meq/L (ref 135–145)

## 2014-11-05 LAB — LIPID PANEL
CHOLESTEROL: 152 mg/dL (ref 0–200)
HDL: 46.2 mg/dL (ref >39.00)
LDL CALC: 95 mg/dL (ref 0–99)
NonHDL: 105.8
Total CHOL/HDL Ratio: 3
Triglycerides: 56 mg/dL (ref 0.0–149.0)
VLDL: 11.2 mg/dL (ref 0.0–40.0)

## 2014-11-05 LAB — TSH: TSH: 1.21 u[IU]/mL (ref 0.35–4.50)

## 2014-11-08 ENCOUNTER — Encounter: Payer: Self-pay | Admitting: *Deleted

## 2015-01-06 ENCOUNTER — Encounter: Payer: Self-pay | Admitting: Primary Care

## 2015-01-06 ENCOUNTER — Ambulatory Visit (INDEPENDENT_AMBULATORY_CARE_PROVIDER_SITE_OTHER): Payer: 59 | Admitting: Primary Care

## 2015-01-06 VITALS — BP 108/64 | HR 71 | Temp 98.7°F | Ht 66.0 in | Wt 159.8 lb

## 2015-01-06 DIAGNOSIS — M79671 Pain in right foot: Secondary | ICD-10-CM | POA: Diagnosis not present

## 2015-01-06 DIAGNOSIS — R109 Unspecified abdominal pain: Secondary | ICD-10-CM | POA: Insufficient documentation

## 2015-01-06 DIAGNOSIS — R10A1 Flank pain, right side: Secondary | ICD-10-CM | POA: Insufficient documentation

## 2015-01-06 DIAGNOSIS — R1011 Right upper quadrant pain: Secondary | ICD-10-CM | POA: Diagnosis not present

## 2015-01-06 LAB — POCT URINALYSIS DIPSTICK
Bilirubin, UA: NEGATIVE
GLUCOSE UA: NEGATIVE
KETONES UA: NEGATIVE
LEUKOCYTES UA: NEGATIVE
NITRITE UA: NEGATIVE
PROTEIN UA: NEGATIVE
RBC UA: NEGATIVE
SPEC GRAV UA: 1.01
UROBILINOGEN UA: NEGATIVE
pH, UA: 6

## 2015-01-06 NOTE — Progress Notes (Signed)
Subjective:    Patient ID: Sarah Mcdaniel, female    DOB: [birth year], 39 y.o.   MRN: [id number]  HPI  Sarah Mcdaniel is a 39 year old female who presents today with a multiple complaints.  1) Flank pain: She had a sudden onset of right flank pain with radiation to right groin that began yesterday when she was sitting on the cough. The pain was sharp/dull in nature and lasted 10-15 min. She then experienced the pain this morning which woke her up from sleep. The pain gradually worsened so she soaked in a warm bath until the pain decreased. The pain eventually dissipated and has not returned. Denies dysuria, urgency,hematuria. She drinks a moderate amount of water daily and does not consume sodas or caffeine. She takes "deep blue" essential oils daily, and has not had any OTC medications for these symptoms.  2) Right foot pain: This has been present for 5 days intermittently. She reports sharp, shooting pain from plantar surface of foot up just proximal to ankle. The pain is intermittent and she notices it when she's on her feet. 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 She has not taken any medication OTC for the pain.   Review of Systems  Constitutional: Negative for fever and chills.  HENT: Negative for rhinorrhea.   Respiratory: Negative for cough and shortness of breath.   Cardiovascular: Negative for chest pain and leg swelling.  Gastrointestinal: Positive for diarrhea. Negative for nausea, vomiting, abdominal pain and blood in stool.  Genitourinary: Positive for frequency, flank pain and pelvic pain. Negative for dysuria, urgency, vaginal discharge and difficulty urinating.  Musculoskeletal: Negative for myalgias.  Hematological: Negative for adenopathy.       Past Medical History  Diagnosis Date  . History of depression     h/o seasonal affective disorder  . Seasonal allergies    mild  . History of HPV infection     resolved    History   Social History  . Marital Status: Married    Spouse Name: N/A  . Number of Children: N/A  . Years of Education: N/A   Occupational History  . Not on file.   Social History Main Topics  . Smoking status: Former Smoker -- 0.50 packs/day for 12 years    Types: Cigarettes    Start date: 10/22/1989    Quit date: 10/22/1998  . Smokeless tobacco: Never Used  . Alcohol Use: 0.0 oz/week    0 Standard drinks or equivalent per week     Comment: occasional  . Drug Use: No  . Sexual Activity: Not on file   Other Topics Concern  . Not on file   Social History Narrative   Lives with husband and 3 children, MIL and FIL, 1 dog   Occupation: Therapist, sports at Designer, fashion/clothing OR at [employer]   Edu: RN   Activity: currently going through challenge - 5-6x/wk exercising regularly   Diet: good water 64oz, fruits/vegetables daily    Past Surgical History  Procedure Laterality Date  . No past surgeries      Family History  Problem Relation Age of Onset  . Hypertension Mother   . Diabetes Father   . Heart failure Paternal Grandfather   . CAD Other     father's side - pacemaker  . Stroke Maternal Grandmother   . Asthma Maternal Grandfather   . Cancer Neg Hx   . Hyperlipidemia  Mother     No Known Allergies  Current Outpatient Prescriptions on File Prior to Visit  Medication Sig Dispense Refill  . levonorgestrel (MIRENA) 20 MCG/24HR IUD 1 each by Intrauterine route once.    . NON FORMULARY dotera essential oil supplement pack     No current facility-administered medications on file prior to visit.    BP 108/64 mmHg  Pulse 71  Temp(Src) 98.7 F (37.1 C) (Oral)  Ht 5\' 6"  (1.676 m)  Wt 159 lb 12.8 oz (72.485 kg)  BMI 25.80 kg/m2  SpO2 97%    Objective:   Physical Exam  Constitutional: She is oriented to person, place, and time. She appears well-developed.  HENT:  Head: Normocephalic.  Cardiovascular: Normal rate and regular rhythm.     Pulmonary/Chest: Effort normal and breath sounds normal.  Abdominal: Soft. Bowel sounds are normal. She exhibits no distension and no mass. There is no tenderness.  Right CVA tenderness  Musculoskeletal: Normal range of motion. She exhibits no edema or tenderness.  Neurological: She is alert and oriented to person, place, and time.  Skin: Skin is warm and dry.  Psychiatric: She has a normal mood and affect.          Assessment & Plan:  UA negative for infection or blood

## 2015-01-06 NOTE — Patient Instructions (Signed)
Your urine was negative for infection or blood that would indicate a urinary tract infection or likely hood of a moving Kidney Stone. Use the urine strainer and let me know if you pass any stones. You may use Ibuprofen for pain and inflammation for your flank and foot pain. Follow up if the pain becomes worse or unbearable.  Stay hydrated with water to help flush out any potential stones. Rest your foot and wear compression stockings. I hope you feel better soon!

## 2015-01-06 NOTE — Assessment & Plan Note (Addendum)
Suspect kidney stone that may have passed. UA negative for bacteria or blood. Declines further evaluation with CT scan. Strainer provided for home use. Ibuprofen for pain and stay hydrated with water. Instructions to follow up if development of fevers, chills, severe flank pain.

## 2015-01-06 NOTE — Progress Notes (Signed)
Pre visit review using our clinic review tool, if applicable. No additional management support is needed unless otherwise documented below in the visit note. 

## 2015-01-06 NOTE — Assessment & Plan Note (Signed)
No decreased ROM, erythema, or recent trauma. 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 Ibuprofen for pain and follow up if worsening or bothersome symptoms occur.

## 2015-01-16 ENCOUNTER — Encounter (HOSPITAL_COMMUNITY): Payer: Self-pay | Admitting: Emergency Medicine

## 2015-01-16 ENCOUNTER — Emergency Department (HOSPITAL_COMMUNITY)
Admission: EM | Admit: 2015-01-16 | Discharge: 2015-01-16 | Disposition: A | Discharge status: Home / Self Care | Payer: 59 | Source: Home / Self Care | Attending: Family Medicine | Admitting: Family Medicine

## 2015-01-16 DIAGNOSIS — N390 Urinary tract infection, site not specified: Secondary | ICD-10-CM | POA: Diagnosis not present

## 2015-01-16 LAB — POCT URINALYSIS DIP (DEVICE)
Glucose, UA: 100 mg/dL — AB
Nitrite: POSITIVE — AB
PH: 5 (ref 5.0–8.0)
Protein, ur: 100 mg/dL — AB
SPECIFIC GRAVITY, URINE: 1.02 (ref 1.005–1.030)
Urobilinogen, UA: 4 mg/dL — ABNORMAL HIGH (ref 0.0–1.0)

## 2015-01-16 LAB — POCT PREGNANCY, URINE: PREG TEST UR: NEGATIVE

## 2015-01-16 MED ORDER — CEFUROXIME AXETIL 250 MG PO TABS: 250.0000 mg | ORAL_TABLET | Freq: Two times a day (BID) | ORAL | Status: DC

## 2015-01-16 MED ORDER — FLUCONAZOLE 150 MG PO TABS: 150.0000 mg | ORAL_TABLET | Freq: Once | ORAL | Status: DC

## 2015-01-16 NOTE — Discharge Instructions (Signed)

## 2015-01-16 NOTE — ED Notes (Signed)
C/o  Dysuria.  Urinary urgency.  On set Friday.  Denies pelvic/abdominal pain.  Nausea and diarrhea, no fever.  Pt is taking azo and has increased water intake with no relief in symptoms.

## 2015-01-16 NOTE — ED Provider Notes (Signed)
CSN: [id number]     Arrival date & time 01/16/15  1503 History   First MD Initiated Contact with Patient 01/16/15 1527     Chief Complaint  Patient presents with  . Urinary Tract Infection   (Consider location/radiation/quality/duration/timing/severity/associated sxs/prior Treatment) HPI      39 year old female presents complaining of possible urinary tract infection. She has mild lower abdominal discomfort, burning with urination, dysuria. Her symptoms started a few days ago. She has been treating with Azo and increasing fluids at home which has been helping but this morning her symptoms seem to be worse. She has nausea but no vomiting. No flank pain.   Past Medical History  Diagnosis Date  . History of depression     h/o seasonal affective disorder  . Seasonal allergies     mild  . History of HPV infection     resolved   Past Surgical History  Procedure Laterality Date  . No past surgeries     Family History  Problem Relation Age of Onset  . Hypertension Mother   . Diabetes Father   . Heart failure Paternal Grandfather   . CAD Other     father's side - pacemaker  . Stroke Maternal Grandmother   . Asthma Maternal Grandfather   . Cancer Neg Hx   . Hyperlipidemia Mother    History  Substance Use Topics  . Smoking status: Former Smoker -- 0.50 packs/day for 12 years    Types: Cigarettes    Start date: 10/22/1989    Quit date: 10/22/1998  . Smokeless tobacco: Never Used  . Alcohol Use: 0.0 oz/week    0 Standard drinks or equivalent per week     Comment: occasional   OB History    No data available     Review of Systems  Constitutional: Negative for fever and chills.  Gastrointestinal: Positive for nausea, abdominal pain and diarrhea. Negative for vomiting.  Genitourinary: Positive for dysuria, urgency and frequency. Negative for hematuria, flank pain, vaginal discharge and pelvic pain.  All other systems reviewed and are negative.   Allergies  Review of  patient's allergies indicates no known allergies.  Home Medications   Prior to Admission medications   Medication Sig Start Date End Date Taking? Authorizing Provider  cefUROXime (CEFTIN) 250 MG tablet Take 1 tablet (250 mg total) by mouth 2 (two) times daily with a meal. 01/16/15   Liam Graham, PA-C  levonorgestrel (MIRENA) 20 MCG/24HR IUD 1 each by Intrauterine route once.    Historical Provider, MD  NON FORMULARY dotera essential oil supplement pack    Historical Provider, MD   BP 113/79 mmHg  Pulse 67  Temp(Src) 98.5 F (36.9 C) (Oral)  Resp 14  SpO2 98% Physical Exam  Constitutional: She is oriented to person, place, and time. Vital signs are normal. She appears well-developed and well-nourished. No distress.  HENT:  Head: Normocephalic and atraumatic.  Pulmonary/Chest: Effort normal. No respiratory distress.  Abdominal: Soft. Bowel sounds are normal. She exhibits no distension and no mass. There is no tenderness. There is no rebound, no guarding and no CVA tenderness.  Neurological: She is alert and oriented to person, place, and time. She has normal strength. Coordination normal.  Skin: Skin is warm and dry. No rash noted. She is not diaphoretic.  Psychiatric: She has a normal mood and affect. Judgment normal.  Nursing note and vitals reviewed.   ED Course  Procedures (including critical care time) Labs Review Labs Reviewed  POCT URINALYSIS  DIP (DEVICE) - Abnormal; Notable for the following:    Glucose, UA 100 (*)    Bilirubin Urine SMALL (*)    Ketones, ur TRACE (*)    Hgb urine dipstick SMALL (*)    Protein, ur 100 (*)    Urobilinogen, UA 4.0 (*)    Nitrite POSITIVE (*)    Leukocytes, UA LARGE (*)    All other components within normal limits  URINE CULTURE  URINE CULTURE  POCT PREGNANCY, URINE    Imaging Review No results found.   MDM   1. UTI (lower urinary tract infection)    UTI, no signs of pyelonephritis. Treat with Ceftin, continue symptomatic  management. Follow-up when necessary if worsening. Urine culture sent  Meds ordered this encounter  Medications  . cefUROXime (CEFTIN) 250 MG tablet    Sig: Take 1 tablet (250 mg total) by mouth 2 (two) times daily with a meal.    Dispense:  10 tablet    Refill:  0      Liam Graham, PA-C 01/16/15 1558

## 2015-01-17 LAB — URINE CULTURE: Colony Count: 5000

## 2015-01-21 DIAGNOSIS — N200 Calculus of kidney: Secondary | ICD-10-CM

## 2015-01-21 HISTORY — DX: Calculus of kidney: N20.0

## 2015-01-25 ENCOUNTER — Ambulatory Visit: Payer: 59 | Admitting: Family Medicine

## 2015-01-26 ENCOUNTER — Encounter: Payer: Self-pay | Admitting: Family Medicine

## 2015-01-26 ENCOUNTER — Ambulatory Visit (INDEPENDENT_AMBULATORY_CARE_PROVIDER_SITE_OTHER): Payer: 59 | Admitting: Family Medicine

## 2015-01-26 VITALS — BP 118/70 | HR 63 | Temp 97.8°F | Wt 163.8 lb

## 2015-01-26 DIAGNOSIS — R399 Unspecified symptoms and signs involving the genitourinary system: Secondary | ICD-10-CM | POA: Insufficient documentation

## 2015-01-26 MED ORDER — CIPROFLOXACIN HCL 250 MG PO TABS: 250.0000 mg | ORAL_TABLET | Freq: Two times a day (BID) | ORAL | Status: DC

## 2015-01-26 NOTE — Progress Notes (Signed)
Pre visit review using our clinic review tool, if applicable. No additional management support is needed unless otherwise documented below in the visit note. 

## 2015-01-26 NOTE — Assessment & Plan Note (Signed)
With neg previous cx but positive UA and classic UTI symptoms. Treat with cipro 500 mg twice daily x 5 days, send urine for cx (took AZO- unable to get UA today). Refer to urology as I suspect there is something else going on here- ?IC vs nephrolithiasis. The patient indicates understanding of these issues and agrees with the plan.

## 2015-01-26 NOTE — Patient Instructions (Signed)
Take cipro as directed- 1 tablet twice daily for 5 days.  Please stop by to see Rosaria Ferries on your way out.

## 2015-01-26 NOTE — Progress Notes (Signed)
Subjective:   Patient ID: Sarah Mcdaniel, female    DOB: 02-Aug-1976, 39 y.o.   MRN: [id number]  Sarah Mcdaniel is a pleasant 39 y.o. year old female patient of Dr. Darnell Level, new to me, who presents to clinic today with Hospitalization Follow-up  on 01/26/2015  HPI: Notes reviewed.  Went to [facility] on 01/16/15 for possible UTI- suprapubic pressure, dysuria.   UA positive for Nitrites and Large LE.  Treated with 5 day course of Ceftin 250 mg twice daily. Urine cx grew only 5,000 colonies.  She is here today because she is still symptomatic.  Has completed course of Ceftin.  Initially felt better but symptoms returned shortly after she completed ceftin. Having more dysuria and increased frequency.  Of note, saw Alma Friendly on 3/17 for flank pain- note reviewed. UA neg.  She suggested straining her urine to look for kidney stones since at that time, she did not want to proceed with CT.  Currently does not have any flank pain, nausea, vomiting or fever.  Prior to these visits, she did not have a h/o recurrent UTIs.  Current Outpatient Prescriptions on File Prior to Visit  Medication Sig Dispense Refill  . levonorgestrel (MIRENA) 20 MCG/24HR IUD 1 each by Intrauterine route once.    . NON FORMULARY dotera essential oil supplement pack     No current facility-administered medications on file prior to visit.    No Known Allergies  Past Medical History  Diagnosis Date  . History of depression     h/o seasonal affective disorder  . Seasonal allergies     mild  . History of HPV infection     resolved    Past Surgical History  Procedure Laterality Date  . No past surgeries      Family History  Problem Relation Age of Onset  . Hypertension Mother   . Diabetes Father   . Heart failure Paternal Grandfather   . CAD Other     father's side - pacemaker  . Stroke Maternal Grandmother   . Asthma Maternal Grandfather   . Cancer Neg Hx   . Hyperlipidemia Mother     History    Social History  . Marital Status: Married    Spouse Name: N/A  . Number of Children: N/A  . Years of Education: N/A   Occupational History  . Not on file.   Social History Main Topics  . Smoking status: Former Smoker -- 0.50 packs/day for 12 years    Types: Cigarettes    Start date: 10/22/1989    Quit date: 10/22/1998  . Smokeless tobacco: Never Used  . Alcohol Use: 0.0 oz/week    0 Standard drinks or equivalent per week     Comment: occasional  . Drug Use: No  . Sexual Activity: Not on file   Other Topics Concern  . 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   Review of Systems  Constitutional: Negative for chills, fatigue and unexpected weight change.  Genitourinary: Positive for dysuria, urgency and frequency. Negative for flank pain.  Musculoskeletal: Negative.  Negative for  back pain.  Skin: Negative.   Neurological: Negative for dizziness.  All other systems reviewed and are negative.      Objective:    BP 118/70 mmHg  Pulse 63  Temp(Src) 97.8 F (36.6 C) (Oral)  Wt 163 lb 12 oz (74.277 kg)  SpO2 97%   Physical Exam  Constitutional: She is oriented to person, place, and time. She appears well-developed and well-nourished. No distress.  HENT:  Head: Normocephalic.  Eyes: Conjunctivae are normal.  Cardiovascular: Normal rate.   Pulmonary/Chest: Effort normal.  Abdominal: There is no tenderness.  Musculoskeletal:  No CVA tenderness  Neurological: She is alert and oriented to person, place, and time. No cranial nerve deficit.  Skin: Skin is warm and dry.  Psychiatric: She has a normal mood  and affect. Her behavior is normal. Judgment and thought content normal.  Nursing note and vitals reviewed.         Assessment & Plan:   Urinary tract infection symptoms No Follow-up on file.

## 2015-01-28 ENCOUNTER — Encounter: Payer: Self-pay | Admitting: Family Medicine

## 2015-01-28 LAB — URINE CULTURE
Colony Count: NO GROWTH
Organism ID, Bacteria: NO GROWTH

## 2015-10-27 DIAGNOSIS — Z6828 Body mass index (BMI) 28.0-28.9, adult: Secondary | ICD-10-CM | POA: Diagnosis not present

## 2015-10-27 DIAGNOSIS — R635 Abnormal weight gain: Secondary | ICD-10-CM | POA: Diagnosis not present

## 2015-10-27 DIAGNOSIS — Z3009 Encounter for other general counseling and advice on contraception: Secondary | ICD-10-CM | POA: Diagnosis not present

## 2015-11-28 DIAGNOSIS — Z30433 Encounter for removal and reinsertion of intrauterine contraceptive device: Secondary | ICD-10-CM | POA: Diagnosis not present

## 2015-11-28 DIAGNOSIS — E669 Obesity, unspecified: Secondary | ICD-10-CM | POA: Diagnosis not present

## 2015-11-28 DIAGNOSIS — Z6826 Body mass index (BMI) 26.0-26.9, adult: Secondary | ICD-10-CM | POA: Diagnosis not present

## 2015-12-15 ENCOUNTER — Encounter: Payer: Self-pay | Admitting: Family Medicine

## 2015-12-15 ENCOUNTER — Ambulatory Visit (INDEPENDENT_AMBULATORY_CARE_PROVIDER_SITE_OTHER): Payer: 59 | Admitting: Family Medicine

## 2015-12-15 VITALS — BP 106/68 | HR 94 | Temp 98.4°F | Wt 159.5 lb

## 2015-12-15 DIAGNOSIS — R05 Cough: Secondary | ICD-10-CM

## 2015-12-15 DIAGNOSIS — R059 Cough, unspecified: Secondary | ICD-10-CM

## 2015-12-15 DIAGNOSIS — R69 Illness, unspecified: Principal | ICD-10-CM

## 2015-12-15 DIAGNOSIS — J111 Influenza due to unidentified influenza virus with other respiratory manifestations: Secondary | ICD-10-CM | POA: Diagnosis not present

## 2015-12-15 LAB — POCT INFLUENZA A/B
INFLUENZA A, POC: NEGATIVE
Influenza B, POC: NEGATIVE

## 2015-12-15 NOTE — Assessment & Plan Note (Signed)
Flu swab negative today. Symptoms most consistent with flu like illness. Supportive care discussed. Red flags to update [place] for abx course discussed. Work note provided today. Pt agrees with plan, declines cough syrup.

## 2015-12-15 NOTE — Progress Notes (Signed)
BP 106/68 mmHg  Pulse 94  Temp(Src) 98.4 F (36.9 C) (Oral)  Wt 159 lb 8 oz (72.349 kg)  SpO2 96%   CC: flu like symptoms  Subjective:    Patient ID: Sarah Mcdaniel, female    DOB: 14-Apr-1976, 40 y.o.   MRN: [id number]  HPI: Sarah Mcdaniel is a 40 y.o. female presenting on 12/15/2015 for Ear Pain; Headache; Fever; Generalized Body Aches; Cough; and Nasal Congestion   Last week 40yo had stomach flu. Then she developed fever.  Saturday pt awoke with malaise, headache. 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 Fever nightly for last 3 months associated with headache. Tmax 101.4. + body aches and marked fatigue. Some abd discomfort and decreased appetite.   Self treated with ibuprofen and sleep over weekend. Also using essential oils.  Out of work all week - needs note for work.  Did receive flu shot.   No nausea/vomiting/diarrhea. No h/o asthma. No smokers at home.   Relevant past medical, surgical, family and social history reviewed and updated as indicated. Interim medical history since our last visit reviewed. Allergies and medications reviewed and updated. Current Outpatient Prescriptions on File Prior to Visit  Medication Sig  . levonorgestrel (MIRENA) 20 MCG/24HR IUD 1 each by Intrauterine route once.  . NON FORMULARY dotera essential oil supplement pack   No current facility-administered medications on file prior to visit.    Review of Systems Per HPI unless specifically indicated in ROS section     Objective:    BP 106/68 mmHg  Pulse 94  Temp(Src) 98.4 F (36.9 C) (Oral)  Wt 159 lb 8 oz (72.349 kg)  SpO2 96%  Wt Readings from Last 3 Encounters:  12/15/15 159 lb 8 oz (72.349 kg)  01/26/15 163 lb 12 oz (74.277 kg)  01/06/15 159 lb 12.8 oz (72.485 kg)    Physical Exam  Constitutional: She appears well-developed and well-nourished. No distress.  HENT:  Head: Normocephalic and atraumatic.  Right Ear: Hearing, tympanic  membrane, external ear and ear canal normal.  Left Ear: Hearing, tympanic membrane, external ear and ear canal normal.  Nose: Mucosal edema and rhinorrhea present. Right sinus exhibits maxillary sinus tenderness. Right sinus exhibits no frontal sinus tenderness. Left sinus exhibits no maxillary sinus tenderness and no frontal sinus tenderness.  Mouth/Throat: Uvula is midline and mucous membranes are normal. Posterior oropharyngeal erythema present. No oropharyngeal exudate, posterior oropharyngeal edema or tonsillar abscesses.  Nasal mucosal congestion and inflammation bilaterally  Eyes: Conjunctivae and EOM are normal. Pupils are equal, round, and reactive to light. No scleral icterus.  Neck: Normal range of motion. Neck supple.  Cardiovascular: Normal rate, regular rhythm, normal heart sounds and intact distal pulses.   No murmur heard. Pulmonary/Chest: Effort normal and breath sounds normal. No respiratory distress. She has no wheezes. She has no rales.  Dry cough present, lungs clear  Lymphadenopathy:    She has cervical adenopathy (R AC LAD).  Skin: Skin is warm and dry. No rash noted.  Nursing note and vitals reviewed.  Results for orders placed or performed in visit on 12/15/15  POCT Influenza A/B  Result Value Ref Range   Influenza A, POC Negative Negative   Influenza B, POC Negative Negative      Assessment & Plan:   Problem List Items Addressed This Visit    Influenza-like illness - Primary    Flu swab negative today. Symptoms most consistent with flu like illness. Supportive care discussed. Red  flags to update [place] for abx course discussed. Work note provided today. Pt agrees with plan, declines cough syrup.       Other Visit Diagnoses    Cough        Relevant Orders    POCT Influenza A/B (Completed)        Follow up plan: Return if symptoms worsen or fail to improve.

## 2015-12-15 NOTE — Progress Notes (Signed)
Pre visit review using our clinic review tool, if applicable. No additional management support is needed unless otherwise documented below in the visit note. 

## 2015-12-15 NOTE — Patient Instructions (Signed)
You have flu like illness. Should resolve over 7-10 days. Push fluids and rest. Out of work until fever free for 24 hours. Continue tylenol/ibuprofen as needed. Watch for persistent fever >101, worsening productive cough, or ongoing headache and sinus symptoms past 10 days - call us for antibiotic if this happens. Call us with any questions.

## 2015-12-16 ENCOUNTER — Telehealth: Payer: Self-pay

## 2015-12-16 NOTE — Telephone Encounter (Signed)
Pt was seen 12/15/15 and last night pts fever was 99.1. Pt wants to know what constitutes a fever. Pt works in [place]. Dr Darnell Level said under 100.4 with no other flu like symptoms pt could return to work; pt said she continues with S/T, congestion and h/a. Pt will stay out of work again today.

## 2016-01-18 DIAGNOSIS — Z01419 Encounter for gynecological examination (general) (routine) without abnormal findings: Secondary | ICD-10-CM | POA: Diagnosis not present

## 2016-01-18 DIAGNOSIS — Z6824 Body mass index (BMI) 24.0-24.9, adult: Secondary | ICD-10-CM | POA: Diagnosis not present

## 2016-02-27 DIAGNOSIS — I83813 Varicose veins of bilateral lower extremities with pain: Secondary | ICD-10-CM | POA: Diagnosis not present

## 2016-03-01 ENCOUNTER — Ambulatory Visit (INDEPENDENT_AMBULATORY_CARE_PROVIDER_SITE_OTHER): Payer: 59 | Admitting: Family Medicine

## 2016-03-01 ENCOUNTER — Encounter: Payer: Self-pay | Admitting: Family Medicine

## 2016-03-01 VITALS — BP 104/66 | HR 88 | Temp 98.8°F | Ht 66.0 in | Wt 151.8 lb

## 2016-03-01 DIAGNOSIS — J069 Acute upper respiratory infection, unspecified: Secondary | ICD-10-CM | POA: Diagnosis not present

## 2016-03-01 DIAGNOSIS — D239 Other benign neoplasm of skin, unspecified: Secondary | ICD-10-CM | POA: Diagnosis not present

## 2016-03-01 DIAGNOSIS — L814 Other melanin hyperpigmentation: Secondary | ICD-10-CM | POA: Diagnosis not present

## 2016-03-01 DIAGNOSIS — Q825 Congenital non-neoplastic nevus: Secondary | ICD-10-CM | POA: Diagnosis not present

## 2016-03-01 DIAGNOSIS — Z808 Family history of malignant neoplasm of other organs or systems: Secondary | ICD-10-CM | POA: Diagnosis not present

## 2016-03-01 DIAGNOSIS — D1801 Hemangioma of skin and subcutaneous tissue: Secondary | ICD-10-CM | POA: Diagnosis not present

## 2016-03-01 DIAGNOSIS — L720 Epidermal cyst: Secondary | ICD-10-CM | POA: Diagnosis not present

## 2016-03-01 DIAGNOSIS — D225 Melanocytic nevi of trunk: Secondary | ICD-10-CM | POA: Diagnosis not present

## 2016-03-01 NOTE — Patient Instructions (Signed)
Nice to meet you. Your symptoms are likely related to a virus or allergies. You treat this with over-the-counter Flonase, Claritin or Zyrtec, and Tylenol or ibuprofen for any discomfort. If you develop shortness of breath, cough productive of blood, fevers, or any new or changing symptoms please seek medical attention.

## 2016-03-01 NOTE — Progress Notes (Signed)
Pre visit review using our clinic review tool, if applicable. No additional management support is needed unless otherwise documented below in the visit note. 

## 2016-03-01 NOTE — Assessment & Plan Note (Signed)
Suspect symptoms are likely related to viral upper respiratory infection versus allergic rhinitis. No focal findings on exam to indicate bacterial illness. We'll treat supportively with Flonase, Claritin or Zyrtec, and Tylenol or ibuprofen. She'll continue to monitor. Given return precautions.

## 2016-03-01 NOTE — Progress Notes (Signed)
Patient ID: Sarah Mcdaniel, female   DOB: 06/30/1976, 40 y.o.   MRN: [id number]  Sarah Rumps, MD Phone: [phone]  Sarah Mcdaniel is a 40 y.o. female who presents today for same-day visit.  Patient notes onset of symptoms on Saturday. Notes rhinorrhea, postnasal drip, mild hoarseness, mostly nonproductive cough. Minimally productive in the morning of clearish cream-colored mucus. Notes her lymph nodes feel swollen. Left ear discomfort. No fevers or shortness of breath. Notes her daughters did mild similar symptoms previously.  PMH: Former smoker   ROS see history of present illness  Objective  Physical Exam Filed Vitals:   03/01/16 1312  BP: 104/66  Pulse: 88  Temp: 98.8 F (37.1 C)    BP Readings from Last 3 Encounters:  03/01/16 104/66  12/15/15 106/68  01/26/15 118/70   Wt Readings from Last 3 Encounters:  03/01/16 151 lb 12.8 oz (68.856 kg)  12/15/15 159 lb 8 oz (72.349 kg)  01/26/15 163 lb 12 oz (74.277 kg)    Physical Exam  Constitutional: She is well-developed, well-nourished, and in no distress.  HENT:  Head: Normocephalic and atraumatic.  Right Ear: External ear normal.  Left Ear: External ear normal.  Mild posterior oropharyngeal erythema with no tonsillar exudate or swelling  Eyes: Conjunctivae are normal. Pupils are equal, round, and reactive to light.  Neck: Neck supple.  Cardiovascular: Normal rate, regular rhythm and normal heart sounds.   Pulmonary/Chest: Effort normal and breath sounds normal.  Lymphadenopathy:    She has cervical adenopathy (anterior cervical lymph nodes mildly enlarged).  Neurological: She is alert. Gait normal.  Skin: Skin is warm and dry. She is not diaphoretic.     Assessment/Plan: Please see individual problem list.  Acute upper respiratory infection Suspect symptoms are likely related to viral upper respiratory infection versus allergic rhinitis. No focal findings on exam to indicate bacterial illness. We'll  treat supportively with Flonase, Claritin or Zyrtec, and Tylenol or ibuprofen. She'll continue to monitor. Given return precautions.    Sarah Rumps, MD [facility]

## 2016-03-05 DIAGNOSIS — I8312 Varicose veins of left lower extremity with inflammation: Secondary | ICD-10-CM | POA: Diagnosis not present

## 2016-03-05 DIAGNOSIS — I8311 Varicose veins of right lower extremity with inflammation: Secondary | ICD-10-CM | POA: Diagnosis not present

## 2016-03-20 DIAGNOSIS — I8312 Varicose veins of left lower extremity with inflammation: Secondary | ICD-10-CM | POA: Diagnosis not present

## 2016-03-20 DIAGNOSIS — I8311 Varicose veins of right lower extremity with inflammation: Secondary | ICD-10-CM | POA: Diagnosis not present

## 2016-04-17 ENCOUNTER — Other Ambulatory Visit: Payer: Self-pay | Admitting: Nurse Practitioner

## 2016-04-17 DIAGNOSIS — N644 Mastodynia: Secondary | ICD-10-CM

## 2016-04-17 DIAGNOSIS — Z3200 Encounter for pregnancy test, result unknown: Secondary | ICD-10-CM | POA: Diagnosis not present

## 2016-04-17 DIAGNOSIS — Z30431 Encounter for routine checking of intrauterine contraceptive device: Secondary | ICD-10-CM | POA: Diagnosis not present

## 2016-04-23 ENCOUNTER — Other Ambulatory Visit: Payer: 59

## 2016-04-26 ENCOUNTER — Ambulatory Visit
Admission: RE | Admit: 2016-04-26 | Discharge: 2016-04-26 | Disposition: A | Discharge status: Home / Self Care | Payer: 59 | Source: Ambulatory Visit | Attending: Nurse Practitioner | Admitting: Nurse Practitioner

## 2016-04-26 DIAGNOSIS — N644 Mastodynia: Secondary | ICD-10-CM

## 2016-08-08 DIAGNOSIS — I83811 Varicose veins of right lower extremities with pain: Secondary | ICD-10-CM | POA: Diagnosis not present

## 2016-08-08 DIAGNOSIS — I8311 Varicose veins of right lower extremity with inflammation: Secondary | ICD-10-CM | POA: Diagnosis not present

## 2016-08-10 DIAGNOSIS — I8311 Varicose veins of right lower extremity with inflammation: Secondary | ICD-10-CM | POA: Diagnosis not present

## 2016-08-15 DIAGNOSIS — L720 Epidermal cyst: Secondary | ICD-10-CM | POA: Diagnosis not present

## 2016-08-15 DIAGNOSIS — Z23 Encounter for immunization: Secondary | ICD-10-CM | POA: Diagnosis not present

## 2016-09-03 DIAGNOSIS — R102 Pelvic and perineal pain: Secondary | ICD-10-CM | POA: Diagnosis not present

## 2016-09-03 DIAGNOSIS — N939 Abnormal uterine and vaginal bleeding, unspecified: Secondary | ICD-10-CM | POA: Diagnosis not present

## 2016-09-05 DIAGNOSIS — I8311 Varicose veins of right lower extremity with inflammation: Secondary | ICD-10-CM | POA: Diagnosis not present

## 2016-09-05 DIAGNOSIS — I83811 Varicose veins of right lower extremities with pain: Secondary | ICD-10-CM | POA: Diagnosis not present

## 2016-09-19 DIAGNOSIS — I8312 Varicose veins of left lower extremity with inflammation: Secondary | ICD-10-CM | POA: Diagnosis not present

## 2016-09-19 DIAGNOSIS — I83812 Varicose veins of left lower extremities with pain: Secondary | ICD-10-CM | POA: Diagnosis not present

## 2016-10-03 DIAGNOSIS — M7981 Nontraumatic hematoma of soft tissue: Secondary | ICD-10-CM | POA: Diagnosis not present

## 2016-10-03 DIAGNOSIS — I83811 Varicose veins of right lower extremities with pain: Secondary | ICD-10-CM | POA: Diagnosis not present

## 2016-10-03 DIAGNOSIS — I8311 Varicose veins of right lower extremity with inflammation: Secondary | ICD-10-CM | POA: Diagnosis not present

## 2016-10-31 DIAGNOSIS — I8312 Varicose veins of left lower extremity with inflammation: Secondary | ICD-10-CM | POA: Diagnosis not present

## 2016-10-31 DIAGNOSIS — I83892 Varicose veins of left lower extremities with other complications: Secondary | ICD-10-CM | POA: Diagnosis not present

## 2016-11-01 IMAGING — MG 2D DIGITAL DIAGNOSTIC BILATERAL MAMMOGRAM WITH CAD AND ADJUNCT T
8 of 12 series · 8 of 28 positions shown · non-contrast
Comparison: Previous exam(s).

ADDENDUM:
Error in report: Initially dictated BI-RADS category is incorrect.
There are NO suspicious findings within either breast, as described
in the body and impression of the report.

BI-RADS category should read as follows:
BI-RADS CATEGORY 1: Negative
CLINICAL DATA: Patient describes having pain deep to the nipples
bilaterally which has recently resolved. Patient has no complaints
today. Patient denies palpable lump or nipple discharge.
EXAM:
2D DIGITAL DIAGNOSTIC BILATERAL MAMMOGRAM WITH CAD AND ADJUNCT TOMO

[R CC synth-2D]
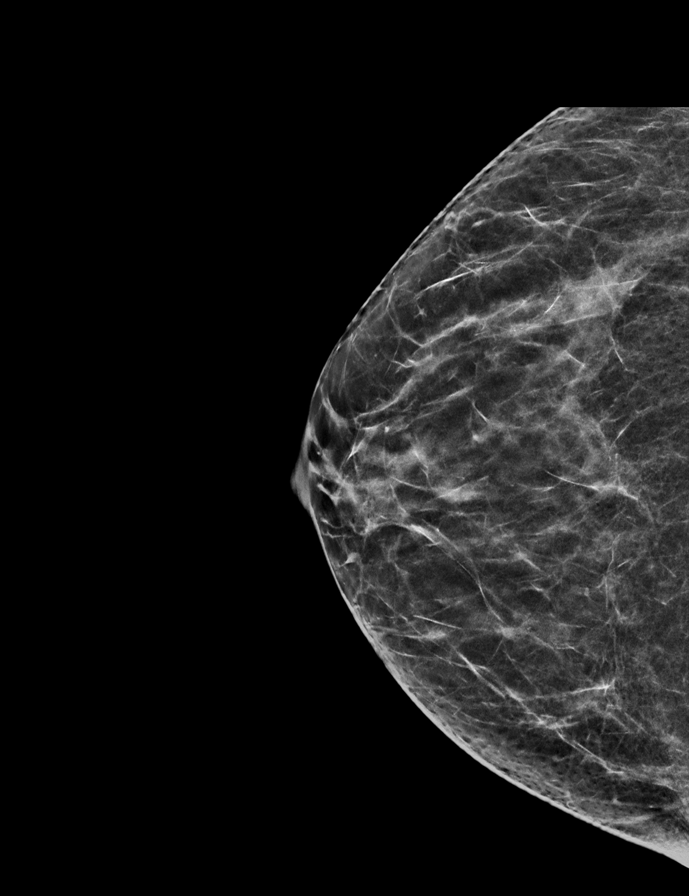

[L CC synth-2D]
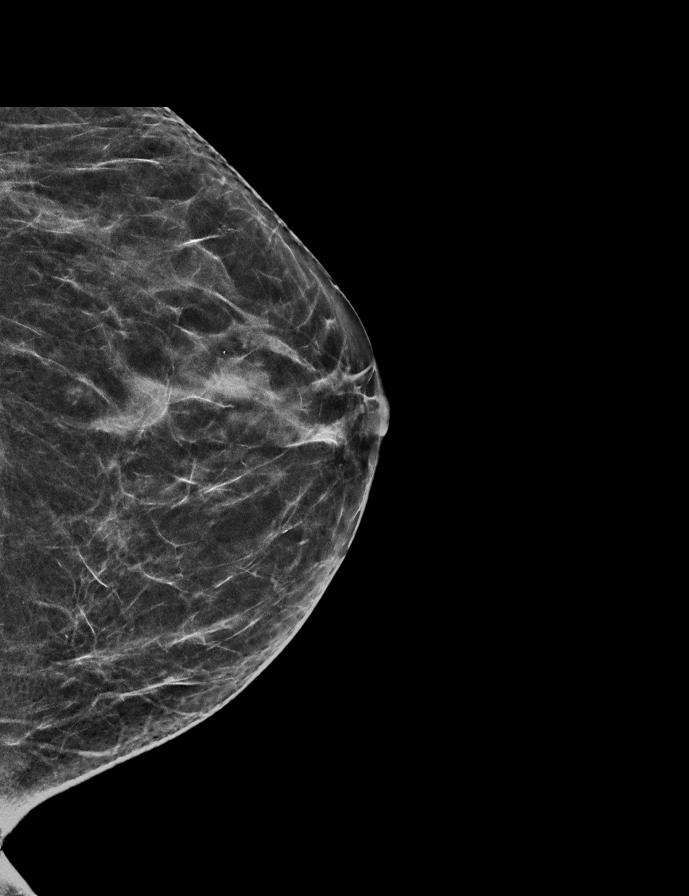

[L MLO synth-2D]
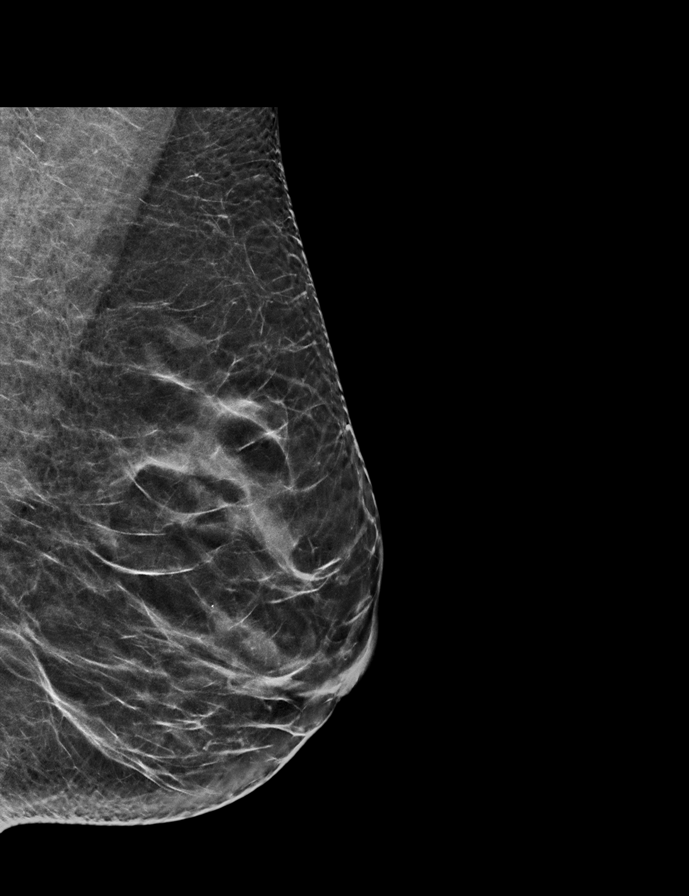

[R MLO synth-2D]
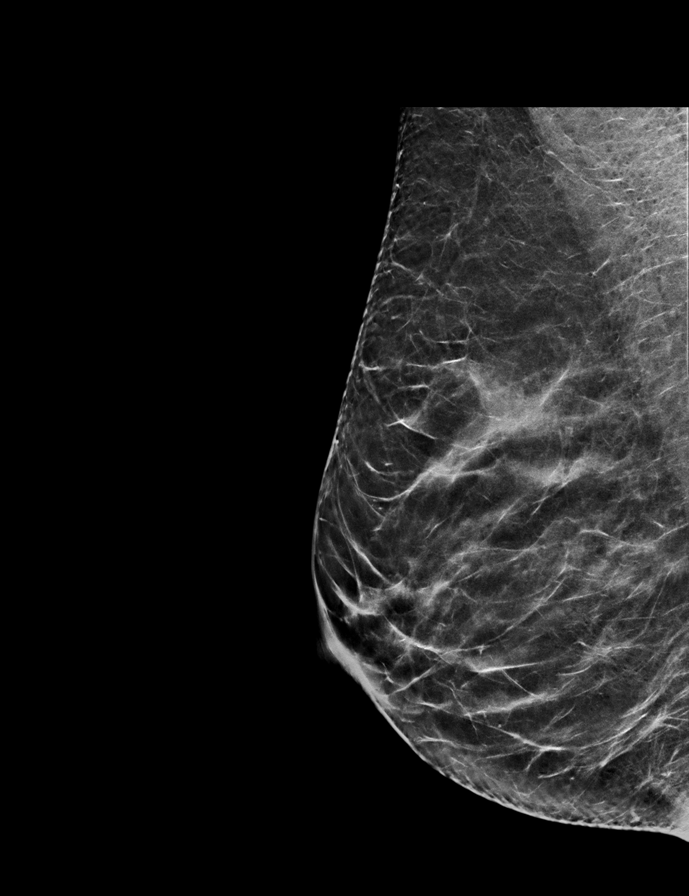

[L CC]
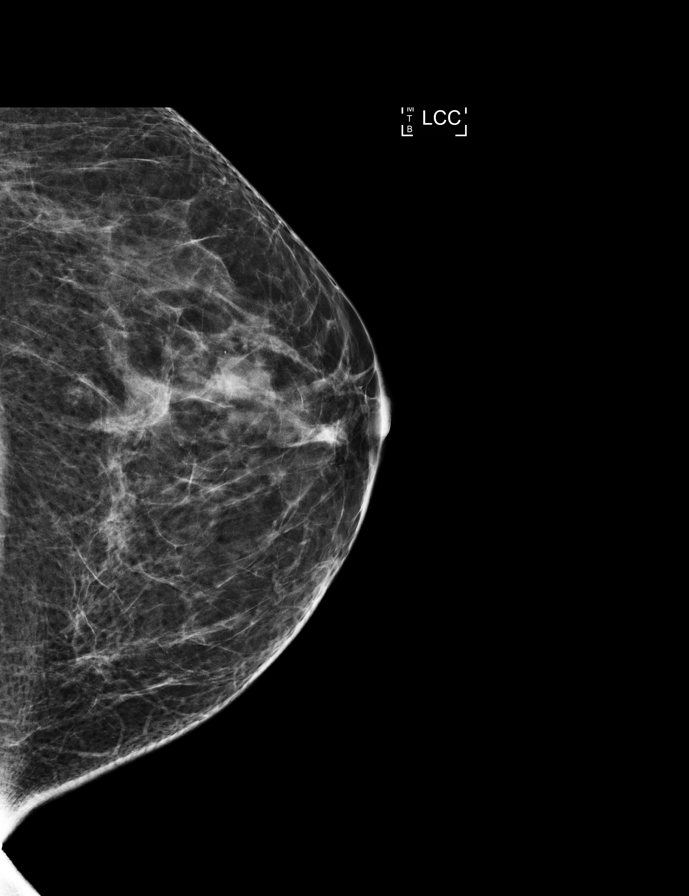

[R CC]
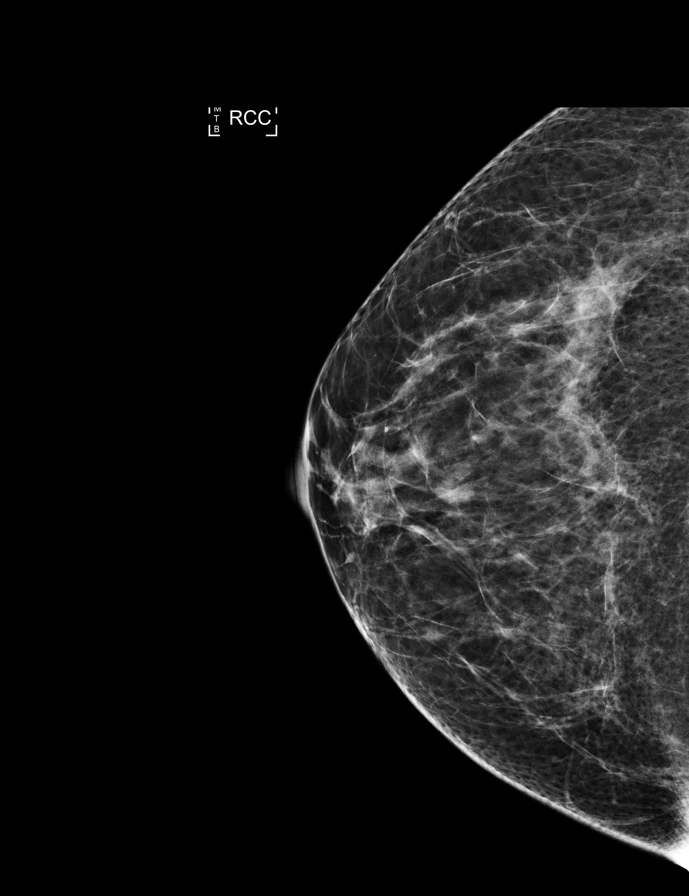

[R MLO]
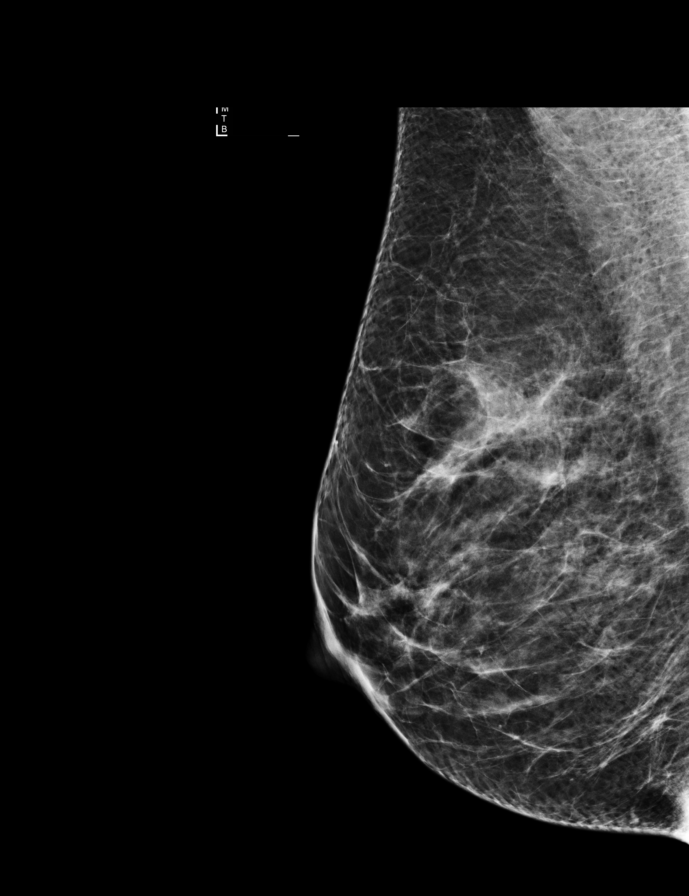

[L MLO]
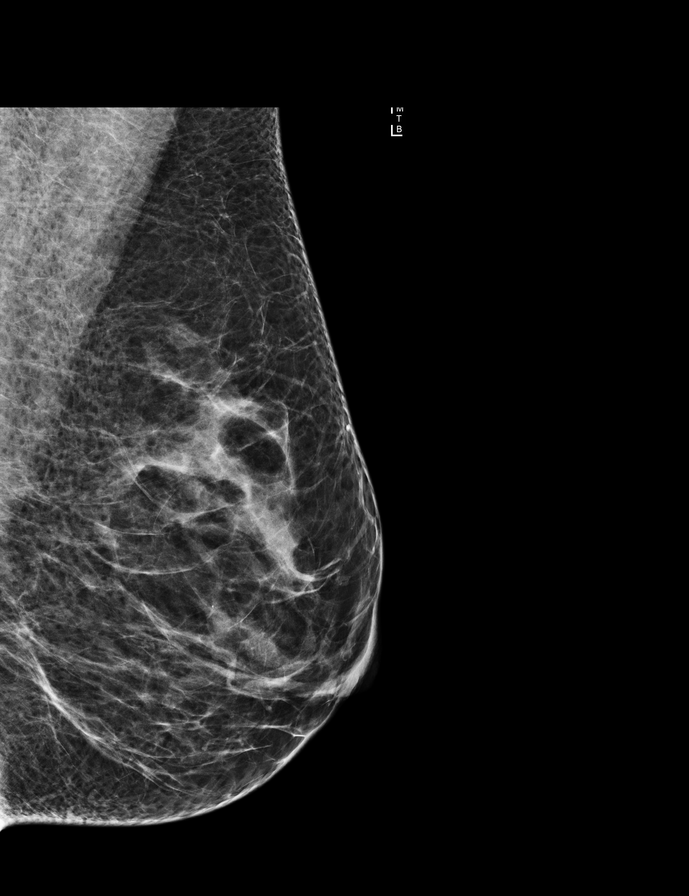

[8 of 28 positions shown; findings below may reference images not displayed]

ACR Breast Density Category c: The breast tissue is heterogeneously
dense, which may obscure small masses.
FINDINGS: Bilateral 2D CC and MLO projections were obtained today, with
additional 3D tomosynthesis. There are no dominant masses,
suspicious calcifications or secondary signs of malignancy within
either breast. Specifically, no periareolar or retroareolar
abnormality bilaterally. No skin or nipple changes seen.

Mammographic images were processed with CAD.
IMPRESSION: No evidence of malignancy within either breast.

RECOMMENDATION:
Screening mammogram at age 41 unless there are persistent or
intervening clinical concerns. (Code:[id number]-W-XUW)

Benign causes of breast pain, and possible remedies, were discussed
with the patient. Patient was encouraged to follow-up with referring
physician if pain became localized and persistent or if a palpable
lump/mass developed.

I have discussed the findings and recommendations with the patient.
Results were also provided in writing at the conclusion of the
visit. If applicable, a reminder letter will be sent to the patient
regarding the next appointment.

BI-RADS CATEGORY  4: Suspicious.

## 2016-11-05 DIAGNOSIS — I8311 Varicose veins of right lower extremity with inflammation: Secondary | ICD-10-CM | POA: Diagnosis not present

## 2016-11-05 DIAGNOSIS — I83891 Varicose veins of right lower extremities with other complications: Secondary | ICD-10-CM | POA: Diagnosis not present

## 2016-11-28 DIAGNOSIS — I8312 Varicose veins of left lower extremity with inflammation: Secondary | ICD-10-CM | POA: Diagnosis not present

## 2016-11-28 DIAGNOSIS — I83812 Varicose veins of left lower extremities with pain: Secondary | ICD-10-CM | POA: Diagnosis not present

## 2016-11-28 DIAGNOSIS — M7981 Nontraumatic hematoma of soft tissue: Secondary | ICD-10-CM | POA: Diagnosis not present

## 2016-12-12 DIAGNOSIS — I8312 Varicose veins of left lower extremity with inflammation: Secondary | ICD-10-CM | POA: Diagnosis not present

## 2016-12-12 DIAGNOSIS — I83812 Varicose veins of left lower extremities with pain: Secondary | ICD-10-CM | POA: Diagnosis not present

## 2017-01-10 DIAGNOSIS — I8311 Varicose veins of right lower extremity with inflammation: Secondary | ICD-10-CM | POA: Diagnosis not present

## 2017-02-25 ENCOUNTER — Ambulatory Visit (INDEPENDENT_AMBULATORY_CARE_PROVIDER_SITE_OTHER): Payer: 59 | Admitting: Family Medicine

## 2017-02-25 ENCOUNTER — Encounter: Payer: Self-pay | Admitting: Family Medicine

## 2017-02-25 VITALS — BP 92/60 | HR 82 | Temp 98.5°F | Ht 66.0 in | Wt 167.5 lb

## 2017-02-25 DIAGNOSIS — R21 Rash and other nonspecific skin eruption: Secondary | ICD-10-CM | POA: Diagnosis not present

## 2017-02-25 DIAGNOSIS — J020 Streptococcal pharyngitis: Secondary | ICD-10-CM | POA: Insufficient documentation

## 2017-02-25 LAB — POCT RAPID STREP A (OFFICE): RAPID STREP A SCREEN: POSITIVE — AB

## 2017-02-25 MED ORDER — AMOXICILLIN 500 MG PO CAPS: 500.0000 mg | ORAL_CAPSULE | Freq: Two times a day (BID) | ORAL | 0 refills | Status: DC

## 2017-02-25 NOTE — Progress Notes (Addendum)
BP 92/60   Pulse 82   Temp 98.5 F (36.9 C) (Oral)   Ht 5\' 6"  (1.676 m)   Wt 167 lb 8 oz (76 kg)   BMI 27.04 kg/m    CC: rash, sinus drainage Subjective:    Patient ID: Sarah Mcdaniel, female    DOB: 1976-01-09, 41 y.o.   MRN: [id number]  HPI: Sarah Mcdaniel is a 41 y.o. female presenting on 02/25/2017 for Rash; Sinus Drainage; and Tenderness on Right side of neck   Recent trip to [place] to visit sister. Drove back Sunday straight 12 hrs.   Over last 24 hours noticing sore thoat, R neck pain, L earache, abd pain, sinus drainage with PNdrainage. Loose stools. Some chills this morning as well as some back pain. More fatigued, weak. + cough mildly productive.   Yesterday morning noticed rash on abdomen crossing midline - non pruritic, not tender, no burning pain, now seems to be spreading up.  No new lotions, detergents, soaps or shampoos.  No new medicines or foods.  No oral lesions. No tick bites.  She did stay at hotel Friday night.   No fevers, tooth pain, headache, dyspnea or wheezing.  Husband with sinus congestion as well.  Non smoker.  No h/o asthma.   Relevant past medical, surgical, family and social history reviewed and updated as indicated. Interim medical history since our last visit reviewed. Allergies and medications reviewed and updated. Outpatient Medications Prior to Visit  Medication Sig Dispense Refill  . levonorgestrel (MIRENA) 20 MCG/24HR IUD 1 each by Intrauterine route once.    . NON FORMULARY dotera essential oil supplement pack    . phentermine 37.5 MG capsule Take 18.75 mg by mouth every morning. Reported on 03/01/2016     No facility-administered medications prior to visit.      Per HPI unless specifically indicated in ROS section below Review of Systems     Objective:    BP 92/60   Pulse 82   Temp 98.5 F (36.9 C) (Oral)   Ht 5\' 6"  (1.676 m)   Wt 167 lb 8 oz (76 kg)   BMI 27.04 kg/m   Wt Readings from Last 3 Encounters:    02/25/17 167 lb 8 oz (76 kg)  03/01/16 151 lb 12.8 oz (68.9 kg)  12/15/15 159 lb 8 oz (72.3 kg)    Physical Exam  Constitutional: She appears well-developed and well-nourished. No distress.  Tired but nontoxic appearing  HENT:  Head: Normocephalic and atraumatic.  Right Ear: Hearing, tympanic membrane, external ear and ear canal normal.  Left Ear: Hearing, tympanic membrane, external ear and ear canal normal.  Nose: No mucosal edema or rhinorrhea. Right sinus exhibits frontal sinus tenderness (mild). Right sinus exhibits no maxillary sinus tenderness. Left sinus exhibits no maxillary sinus tenderness and no frontal sinus tenderness.  Mouth/Throat: Uvula is midline and mucous membranes are normal. Oropharyngeal exudate, posterior oropharyngeal edema and posterior oropharyngeal erythema present. No tonsillar abscesses.  Nasal mucosal congestion and rawness   Eyes: Conjunctivae and EOM are normal. Pupils are equal, round, and reactive to light. No scleral icterus.  Neck: Normal range of motion. Neck supple.  Cardiovascular: Normal rate, regular rhythm, normal heart sounds and intact distal pulses.   No murmur heard. Pulmonary/Chest: Effort normal and breath sounds normal. No respiratory distress. She has no wheezes. She has no rales.  Abdominal: Soft. Normal appearance and bowel sounds are normal. She exhibits no distension and no mass. There is no tenderness.  There is no rigidity, no rebound, no guarding, no CVA tenderness and negative Murphy's sign.  Lymphadenopathy:    She has cervical adenopathy (R tender tonsillar LAD).  Skin: Skin is warm and dry. Rash noted.  Blanching papulopustular erythematous rash along anterior abdomen with some spread up left lateral chest   Psychiatric: She has a normal mood and affect.  Nursing note and vitals reviewed.  Results for orders placed or performed in visit on 02/25/17  POCT rapid strep A  Result Value Ref Range   Rapid Strep A Screen Positive  (A) Negative      Assessment & Plan:   Problem List Items Addressed This Visit    Skin rash    Bedbugs vs viral exanthem. Not consistent with scarlet fever rash. Discussed with patient.       Streptococcal pharyngitis - Primary    RST positive  Treat with amoxicillin course. Push fluids and rest. Further supportive care as per instructions.       Relevant Orders   POCT rapid strep A (Completed)       Follow up plan: Return if symptoms worsen or fail to improve.  Ria Bush, MD

## 2017-02-25 NOTE — Assessment & Plan Note (Addendum)
RST positive  Treat with amoxicillin course. Push fluids and rest. Further supportive care as per instructions.

## 2017-02-25 NOTE — Patient Instructions (Addendum)
strep test was positive today. You have acute bacterial pharyngitis - or strep throat.  Treat with amoxicillin course - sent to pharmacy. Push fluids and plenty of rest. May use ibuprofen for throat inflammation. Salt water gargles. Suck on cold things like popsicles or warm things like herbal teas (whichever soothes the throat better). Return if fever >101.5, worsening pain, or trouble opening/closing mouth, or hoarse voice. Good to see you today, call clinic with questions.   For skin rash - possible viral rash, or possible bed bugs given recent hotel stay. I do recommend wash all clothes and bedding in hot water as precautionary measure. Should continue to improve over time - may take antihistamine for next few days to help with reaction.

## 2017-02-25 NOTE — Assessment & Plan Note (Addendum)
Bedbugs vs viral exanthem. Not consistent with scarlet fever rash. Discussed with patient.

## 2017-02-25 NOTE — Progress Notes (Signed)
Pre visit review using our clinic review tool, if applicable. No additional management support is needed unless otherwise documented below in the visit note. 

## 2017-02-27 ENCOUNTER — Telehealth: Payer: Self-pay

## 2017-02-27 MED ORDER — FLUCONAZOLE 150 MG PO TABS: 150.0000 mg | ORAL_TABLET | Freq: Once | ORAL | 0 refills | Status: AC

## 2017-02-27 MED ORDER — AMOXICILLIN 500 MG PO CAPS: 1000.0000 mg | ORAL_CAPSULE | Freq: Two times a day (BID) | ORAL | 0 refills | Status: DC

## 2017-02-27 NOTE — Telephone Encounter (Signed)
Spoke with patient. rec amox 1000mg  BID - will send in more pills. rec ibuprofen 600mg  TID scheduled x 3-5 days with meals. Pt requests diflucan pill sent to pharmacy.  Update tomorrow - if no better, come in for eval.

## 2017-02-27 NOTE — Telephone Encounter (Signed)
Patient called states her throat is worse she had a temperature of 100.6 yesterday, her glands are swollen the left is worse, is hard to swallow and painful. She has not been using ibuprofen or gargled with salt water.  She's been taking amox BID for two days. Can you see her today?

## 2017-03-06 DIAGNOSIS — Z01419 Encounter for gynecological examination (general) (routine) without abnormal findings: Secondary | ICD-10-CM | POA: Diagnosis not present

## 2017-03-06 DIAGNOSIS — Z6827 Body mass index (BMI) 27.0-27.9, adult: Secondary | ICD-10-CM | POA: Diagnosis not present

## 2017-04-02 ENCOUNTER — Encounter: Payer: Self-pay | Admitting: Family Medicine

## 2017-04-02 ENCOUNTER — Ambulatory Visit (INDEPENDENT_AMBULATORY_CARE_PROVIDER_SITE_OTHER): Payer: 59 | Admitting: Family Medicine

## 2017-04-02 VITALS — BP 98/58 | HR 87 | Temp 98.3°F | Wt 164.8 lb

## 2017-04-02 DIAGNOSIS — R591 Generalized enlarged lymph nodes: Secondary | ICD-10-CM

## 2017-04-02 NOTE — Progress Notes (Signed)
BP (!) 98/58   Pulse 87   Temp 98.3 F (36.8 C) (Oral)   Wt 164 lb 12 oz (74.7 kg)   SpO2 98%   BMI 26.59 kg/m    CC: check knot in neck Subjective:    Patient ID: Sarah Mcdaniel, female    DOB: 1976-09-29, 41 y.o.   MRN: [id number]  HPI: GIAVONNA PFLUM is a 41 y.o. female presenting on 04/02/2017 for Knot in neck   Seen here last month with strep throat treated with amoxicillin course. At that time, she did have R tonsillar LAD. Symptoms did largely resolve.   Ongoing allergy symptoms of post nasal drainage, congestion. No rhinorrhea or itchy eyes.  3d h/o diarrhea. No new foods, no sick contacts.   No unexpected weight loss. No fevers/chills. No coughing.   Relevant past medical, surgical, family and social history reviewed and updated as indicated. Interim medical history since our last visit reviewed. Allergies and medications reviewed and updated. Outpatient Medications Prior to Visit  Medication Sig Dispense Refill  . levonorgestrel (MIRENA) 20 MCG/24HR IUD 1 each by Intrauterine route once.    . NON FORMULARY dotera essential oil supplement pack    . amoxicillin (AMOXIL) 500 MG capsule Take 2 capsules (1,000 mg total) by mouth 2 (two) times daily. To complete 10 day course 20 capsule 0   No facility-administered medications prior to visit.      Per HPI unless specifically indicated in ROS section below Review of Systems     Objective:    BP (!) 98/58   Pulse 87   Temp 98.3 F (36.8 C) (Oral)   Wt 164 lb 12 oz (74.7 kg)   SpO2 98%   BMI 26.59 kg/m   Wt Readings from Last 3 Encounters:  04/02/17 164 lb 12 oz (74.7 kg)  02/25/17 167 lb 8 oz (76 kg)  03/01/16 151 lb 12.8 oz (68.9 kg)    Physical Exam  Constitutional: She appears well-developed and well-nourished. No distress.  HENT:  Head: Normocephalic and atraumatic.  Right Ear: Hearing, tympanic membrane, external ear and ear canal normal.  Left Ear: Hearing, tympanic membrane, external ear and  ear canal normal.  Nose: No mucosal edema or rhinorrhea. Right sinus exhibits no maxillary sinus tenderness and no frontal sinus tenderness. Left sinus exhibits no maxillary sinus tenderness and no frontal sinus tenderness.  Mouth/Throat: Uvula is midline, oropharynx is clear and moist and mucous membranes are normal. No oropharyngeal exudate, posterior oropharyngeal edema, posterior oropharyngeal erythema or tonsillar abscesses.  Eyes: Conjunctivae and EOM are normal. Pupils are equal, round, and reactive to light. No scleral icterus.  Neck: Normal range of motion. Neck supple. No thyromegaly present.  Cardiovascular: Normal rate, regular rhythm, normal heart sounds and intact distal pulses.   No murmur heard. Pulmonary/Chest: Effort normal and breath sounds normal. No respiratory distress. She has no wheezes. She has no rales.  Lymphadenopathy:       Head (right side): Tonsillar (small, nontender) adenopathy present. No submental, no submandibular, no preauricular and no posterior auricular adenopathy present.       Head (left side): No submental, no submandibular, no tonsillar, no preauricular and no posterior auricular adenopathy present.    She has no cervical adenopathy.    She has no axillary adenopathy.       Right axillary: No lateral adenopathy present.       Left axillary: No lateral adenopathy present.      Right: No supraclavicular adenopathy  present.       Left: No supraclavicular adenopathy present.  Skin: Skin is warm and dry. No rash noted.  Nursing note and vitals reviewed.  Results for orders placed or performed in visit on 02/25/17  POCT rapid strep A  Result Value Ref Range   Rapid Strep A Screen Positive (A) Negative      Assessment & Plan:   Problem List Items Addressed This Visit    Head and neck lymphadenopathy - Primary    Isolated to small right tonsillar lymph node. Favor reactive process after recent strep, with allergies acting up, and possible scarred  nodule. Discussed with patient, reassured. Update if enlarging, or other LAD develops.           Follow up plan: No Follow-up on file.  Ria Bush, MD

## 2017-04-02 NOTE — Assessment & Plan Note (Addendum)
Isolated to small right tonsillar lymph node. Favor reactive process after recent strep, with allergies acting up, and possible scarred nodule. Discussed with patient, reassured. Update if enlarging, or other LAD develops.

## 2017-06-12 DIAGNOSIS — B078 Other viral warts: Secondary | ICD-10-CM | POA: Diagnosis not present

## 2017-07-24 DIAGNOSIS — Z23 Encounter for immunization: Secondary | ICD-10-CM | POA: Diagnosis not present

## 2017-07-24 DIAGNOSIS — B078 Other viral warts: Secondary | ICD-10-CM | POA: Diagnosis not present

## 2017-07-24 DIAGNOSIS — B079 Viral wart, unspecified: Secondary | ICD-10-CM | POA: Diagnosis not present

## 2017-08-19 DIAGNOSIS — Z23 Encounter for immunization: Secondary | ICD-10-CM | POA: Diagnosis not present

## 2017-08-19 DIAGNOSIS — B070 Plantar wart: Secondary | ICD-10-CM | POA: Diagnosis not present

## 2017-11-19 DIAGNOSIS — D1801 Hemangioma of skin and subcutaneous tissue: Secondary | ICD-10-CM | POA: Diagnosis not present

## 2017-11-19 DIAGNOSIS — Z23 Encounter for immunization: Secondary | ICD-10-CM | POA: Diagnosis not present

## 2017-11-19 DIAGNOSIS — L814 Other melanin hyperpigmentation: Secondary | ICD-10-CM | POA: Diagnosis not present

## 2017-11-19 DIAGNOSIS — L820 Inflamed seborrheic keratosis: Secondary | ICD-10-CM | POA: Diagnosis not present

## 2017-11-19 DIAGNOSIS — L821 Other seborrheic keratosis: Secondary | ICD-10-CM | POA: Diagnosis not present

## 2017-11-19 DIAGNOSIS — D225 Melanocytic nevi of trunk: Secondary | ICD-10-CM | POA: Diagnosis not present

## 2018-03-12 DIAGNOSIS — Z01419 Encounter for gynecological examination (general) (routine) without abnormal findings: Secondary | ICD-10-CM | POA: Diagnosis not present

## 2018-03-12 DIAGNOSIS — Z1231 Encounter for screening mammogram for malignant neoplasm of breast: Secondary | ICD-10-CM | POA: Diagnosis not present

## 2018-03-12 DIAGNOSIS — Z6825 Body mass index (BMI) 25.0-25.9, adult: Secondary | ICD-10-CM | POA: Diagnosis not present

## 2018-03-12 LAB — HM PAP SMEAR: HM PAP: NORMAL

## 2018-03-12 LAB — HM MAMMOGRAPHY: HM MAMMO: NORMAL (ref 0–4)

## 2018-04-04 ENCOUNTER — Ambulatory Visit: Payer: 59 | Admitting: Family Medicine

## 2018-04-04 ENCOUNTER — Encounter: Payer: Self-pay | Admitting: Family Medicine

## 2018-04-04 VITALS — Temp 98.3°F | Ht 66.0 in | Wt 155.5 lb

## 2018-04-04 DIAGNOSIS — R42 Dizziness and giddiness: Secondary | ICD-10-CM

## 2018-04-04 DIAGNOSIS — Z13220 Encounter for screening for lipoid disorders: Secondary | ICD-10-CM

## 2018-04-04 LAB — BASIC METABOLIC PANEL
BUN: 7 mg/dL (ref 6–23)
CO2: 29 mEq/L (ref 19–32)
Calcium: 9.6 mg/dL (ref 8.4–10.5)
Chloride: 104 mEq/L (ref 96–112)
Creatinine, Ser: 0.63 mg/dL (ref 0.40–1.20)
GFR: 110.82 mL/min (ref >60.00)
GLUCOSE: 89 mg/dL (ref 70–99)
Potassium: 3.7 mEq/L (ref 3.5–5.1)
Sodium: 141 mEq/L (ref 135–145)

## 2018-04-04 LAB — CBC WITH DIFFERENTIAL/PLATELET
BASOS PCT: 1.2 % (ref 0.0–3.0)
Basophils Absolute: 0.1 10*3/uL (ref 0.0–0.1)
EOS ABS: 0.2 10*3/uL (ref 0.0–0.7)
EOS PCT: 3.1 % (ref 0.0–5.0)
HEMATOCRIT: 41 % (ref 36.0–46.0)
HEMOGLOBIN: 13.9 g/dL (ref 12.0–15.0)
LYMPHS PCT: 37.7 % (ref 12.0–46.0)
Lymphs Abs: 2.2 10*3/uL (ref 0.7–4.0)
MCHC: 33.8 g/dL (ref 30.0–36.0)
MCV: 85.1 fl (ref 78.0–100.0)
Monocytes Absolute: 0.4 10*3/uL (ref 0.1–1.0)
Monocytes Relative: 6.7 % (ref 3.0–12.0)
Neutro Abs: 3 10*3/uL (ref 1.4–7.7)
Neutrophils Relative %: 51.3 % (ref 43.0–77.0)
Platelets: 263 10*3/uL (ref 150.0–400.0)
RBC: 4.82 Mil/uL (ref 3.87–5.11)
RDW: 12.6 % (ref 11.5–15.5)
WBC: 5.8 10*3/uL (ref 4.0–10.5)

## 2018-04-04 LAB — LIPID PANEL
CHOL/HDL RATIO: 3
Cholesterol: 166 mg/dL (ref 0–200)
HDL: 49.8 mg/dL (ref >39.00)
LDL CALC: 98 mg/dL (ref 0–99)
NONHDL: 116.69
Triglycerides: 95 mg/dL (ref 0.0–149.0)
VLDL: 19 mg/dL (ref 0.0–40.0)

## 2018-04-04 LAB — TSH: TSH: 1.15 u[IU]/mL (ref 0.35–4.50)

## 2018-04-04 NOTE — Patient Instructions (Addendum)
Labs today for dizziness.  Orthostatic vital signs today.  Continue good hydration status.

## 2018-04-04 NOTE — Progress Notes (Addendum)
BP 110/70 (BP Location: Left Arm, Patient Position: Sitting, Cuff Size: Normal)   Pulse 76   Temp 98.3 F (36.8 C) (Oral)   Ht 5\' 6"  (1.676 m)   Wt 155 lb 8 oz (70.5 kg)   SpO2 97%   BMI 25.10 kg/m    .No data found.  CC: dizziness Subjective:    Patient ID: Sarah Mcdaniel, female    DOB: [birth year], 42 y.o.   MRN: [id number]  HPI: Sarah Mcdaniel is a 42 y.o. female presenting on 04/04/2018 for Dizziness (Sxs started 03/29/18. Says she was sitting and had feeling the room was turning. Also, c/o vision issues. )   1 wk h/o intermittent dizziness, unsteady/woozy, lightheaded. Worse with turning head too fast. Had 2 episodes of room spinning. Presyncope x1. No LOC, no imbalance. She did have headache earlier this week (usually no headaches). Occasional tinnitus. Quick tachycardia with activity.   Feels she stays well hydrated. Avoids caffeine. Drinks biocoffee - wheat grass.   No fevers/chills, double vision, hearing changes. No chest pain, tightness, dyspnea, palpitations.  Energy levels ok.  Fragmented sleep recently due to job.  No new supplements or vitamins or OTC remedies.   Mirena IUD in place. OBGYN - Dr Corinna Capra Eye exam - not recently.   Took some phentermine to help weight loss - last took 2 wks ago, none since.  Some increased anxiety - mother about to have open heart surgery. 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  Relevant past medical, surgical, family and social history reviewed and updated as indicated. Interim medical history since our last visit reviewed. Allergies and medications reviewed and updated. Outpatient Medications Prior to Visit  Medication Sig Dispense Refill  . levonorgestrel (MIRENA) 20 MCG/24HR IUD 1 each by Intrauterine route once.    . NON FORMULARY dotera essential oil supplement pack     No facility-administered medications prior to visit.      Per HPI unless specifically indicated in ROS section below Review of Systems     Objective:    BP  110/70 (BP Location: Left Arm, Patient Position: Sitting, Cuff Size: Normal)   Pulse 76   Temp 98.3 F (36.8 C) (Oral)   Ht 5\' 6"  (1.676 m)   Wt 155 lb 8 oz (70.5 kg)   SpO2 97%   BMI 25.10 kg/m   Wt Readings from Last 3 Encounters:  04/04/18 155 lb 8 oz (70.5 kg)  04/02/17 164 lb 12 oz (74.7 kg)  02/25/17 167 lb 8 oz (76 kg)    Physical Exam  Constitutional: She appears well-developed and well-nourished. No distress.  HENT:  Head: Normocephalic and atraumatic.  Mouth/Throat: Oropharynx is clear and moist. No oropharyngeal exudate.  Eyes: Pupils are equal, round, and reactive to light. Conjunctivae and EOM are normal.  Neck: Normal range of motion. Neck supple.  Cardiovascular: Normal rate, regular rhythm and normal heart sounds.  No murmur heard. Pulmonary/Chest: Effort normal and breath sounds normal. No respiratory distress. She has no wheezes. She has no rales.  Musculoskeletal: She exhibits no edema.  Neurological: She is alert. She has normal strength. No cranial nerve deficit or sensory deficit. She displays a negative Romberg sign. Coordination and gait normal.  CN 2-12 intact FTN intact EOMI Somewhat unsteady with romberg Neg pronator drift Neg dix hallpike  Skin: Skin is warm and dry. No rash noted.  Psychiatric: She has a normal mood and affect.  Nursing note and vitals reviewed.      Assessment & Plan:  Problem List Items Addressed This Visit    Dizziness - Primary    Nonspecific symptoms, reassuring benign exam today. Check labs today and orthostatics. ?phentermine related. Encouraged good hydration status, continue to avoid caffeine.  Update if not improving with this.       Relevant Orders   TSH   CBC with Differential/Platelet   Basic metabolic panel    Other Visit Diagnoses    Lipid screening       Relevant Orders   Lipid panel       No orders of the defined types were placed in this encounter.  Orders Placed This Encounter  Procedures   . TSH  . CBC with Differential/Platelet  . Basic metabolic panel  . Lipid panel    Follow up plan: No follow-ups on file.  Ria Bush, MD

## 2018-04-04 NOTE — Assessment & Plan Note (Signed)
Nonspecific symptoms, reassuring benign exam today. Check labs today and orthostatics. ?phentermine related. Encouraged good hydration status, continue to avoid caffeine.  Update if not improving with this.

## 2018-04-30 ENCOUNTER — Ambulatory Visit (INDEPENDENT_AMBULATORY_CARE_PROVIDER_SITE_OTHER): Payer: 59 | Admitting: Family Medicine

## 2018-04-30 ENCOUNTER — Encounter: Payer: Self-pay | Admitting: Family Medicine

## 2018-04-30 VITALS — BP 116/68 | HR 75 | Temp 98.5°F | Ht 66.0 in | Wt 158.8 lb

## 2018-04-30 DIAGNOSIS — Z0000 Encounter for general adult medical examination without abnormal findings: Secondary | ICD-10-CM | POA: Diagnosis not present

## 2018-04-30 NOTE — Patient Instructions (Addendum)
Check on tetanus and measles (MMR) status  Visual acuity today.  Blood work was normal. You are doing well today Return as needed or in 1-2 year for next physical.   Health Maintenance, Female Adopting a healthy lifestyle and getting preventive care can go a long way to promote health and wellness. Talk with your health care provider about what schedule of regular examinations is right for you. This is a good chance for you to check in with your provider about disease prevention and staying healthy. In between checkups, there are plenty of things you can do on your own. Experts have done a lot of research about which lifestyle changes and preventive measures are most likely to keep you healthy. Ask your health care provider for more information. Weight and diet Eat a healthy diet  Be sure to include plenty of vegetables, fruits, low-fat dairy products, and lean protein.  Do not eat a lot of foods high in solid fats, added sugars, or salt.  Get regular exercise. This is one of the most important things you can do for your health. ? Most adults should exercise for at least 150 minutes each week. The exercise should increase your heart rate and make you sweat (moderate-intensity exercise). ? Most adults should also do strengthening exercises at least twice a week. This is in addition to the moderate-intensity exercise.  Maintain a healthy weight  Body mass index (BMI) is a measurement that can be used to identify possible weight problems. It estimates body fat based on height and weight. Your health care provider can help determine your BMI and help you achieve or maintain a healthy weight.  For females 56 years of age and older: ? A BMI below 18.5 is considered underweight. ? A BMI of 18.5 to 24.9 is normal. ? A BMI of 25 to 29.9 is considered overweight. ? A BMI of 30 and above is considered obese.  Watch levels of cholesterol and blood lipids  You should start having your blood tested  for lipids and cholesterol at 41 years of age, then have this test every 5 years.  You may need to have your cholesterol levels checked more often if: ? Your lipid or cholesterol levels are high. ? You are older than 41 years of age. ? You are at high risk for heart disease.  Cancer screening Lung Cancer  Lung cancer screening is recommended for adults 59-55 years old who are at high risk for lung cancer because of a history of smoking.  A yearly low-dose CT scan of the lungs is recommended for people who: ? Currently smoke. ? Have quit within the past 15 years. ? Have at least a 30-pack-year history of smoking. A pack year is smoking an average of one pack of cigarettes a day for 1 year.  Yearly screening should continue until it has been 15 years since you quit.  Yearly screening should stop if you develop a health problem that would prevent you from having lung cancer treatment.  Breast Cancer  Practice breast self-awareness. This means understanding how your breasts normally appear and feel.  It also means doing regular breast self-exams. Let your health care provider know about any changes, no matter how small.  If you are in your 20s or 30s, you should have a clinical breast exam (CBE) by a health care provider every 1-3 years as part of a regular health exam.  If you are 48 or older, have a CBE every year. Also consider  having a breast X-ray (mammogram) every year.  If you have a family history of breast cancer, talk to your health care provider about genetic screening.  If you are at high risk for breast cancer, talk to your health care provider about having an MRI and a mammogram every year.  Breast cancer gene (BRCA) assessment is recommended for women who have family members with BRCA-related cancers. BRCA-related cancers include: ? Breast. ? Ovarian. ? Tubal. ? Peritoneal cancers.  Results of the assessment will determine the need for genetic counseling and BRCA1  and BRCA2 testing.  Cervical Cancer Your health care provider may recommend that you be screened regularly for cancer of the pelvic organs (ovaries, uterus, and vagina). This screening involves a pelvic examination, including checking for microscopic changes to the surface of your cervix (Pap test). You may be encouraged to have this screening done every 3 years, beginning at age 62.  For women ages 39-65, health care providers may recommend pelvic exams and Pap testing every 3 years, or they may recommend the Pap and pelvic exam, combined with testing for human papilloma virus (HPV), every 5 years. Some types of HPV increase your risk of cervical cancer. Testing for HPV may also be done on women of any age with unclear Pap test results.  Other health care providers may not recommend any screening for nonpregnant women who are considered low risk for pelvic cancer and who do not have symptoms. Ask your health care provider if a screening pelvic exam is right for you.  If you have had past treatment for cervical cancer or a condition that could lead to cancer, you need Pap tests and screening for cancer for at least 20 years after your treatment. If Pap tests have been discontinued, your risk factors (such as having a new sexual partner) need to be reassessed to determine if screening should resume. Some women have medical problems that increase the chance of getting cervical cancer. In these cases, your health care provider may recommend more frequent screening and Pap tests.  Colorectal Cancer  This type of cancer can be detected and often prevented.  Routine colorectal cancer screening usually begins at 41 years of age and continues through 41 years of age.  Your health care provider may recommend screening at an earlier age if you have risk factors for colon cancer.  Your health care provider may also recommend using home test kits to check for hidden blood in the stool.  A small camera at  the end of a tube can be used to examine your colon directly (sigmoidoscopy or colonoscopy). This is done to check for the earliest forms of colorectal cancer.  Routine screening usually begins at age 2.  Direct examination of the colon should be repeated every 5-10 years through 41 years of age. However, you may need to be screened more often if early forms of precancerous polyps or small growths are found.  Skin Cancer  Check your skin from head to toe regularly.  Tell your health care provider about any new moles or changes in moles, especially if there is a change in a mole's shape or color.  Also tell your health care provider if you have a mole that is larger than the size of a pencil eraser.  Always use sunscreen. Apply sunscreen liberally and repeatedly throughout the day.  Protect yourself by wearing long sleeves, pants, a wide-brimmed hat, and sunglasses whenever you are outside.  Heart disease, diabetes, and high blood pressure  High blood pressure causes heart disease and increases the risk of stroke. High blood pressure is more likely to develop in: ? People who have blood pressure in the high end of the normal range (130-139/85-89 mm Hg). ? People who are overweight or obese. ? People who are African American.  If you are 32-81 years of age, have your blood pressure checked every 3-5 years. If you are 37 years of age or older, have your blood pressure checked every year. You should have your blood pressure measured twice-once when you are at a hospital or clinic, and once when you are not at a hospital or clinic. Record the average of the two measurements. To check your blood pressure when you are not at a hospital or clinic, you can use: ? An automated blood pressure machine at a pharmacy. ? A home blood pressure monitor.  If you are between 27 years and 26 years old, ask your health care provider if you should take aspirin to prevent strokes.  Have regular diabetes  screenings. This involves taking a blood sample to check your fasting blood sugar level. ? If you are at a normal weight and have a low risk for diabetes, have this test once every three years after 41 years of age. ? If you are overweight and have a high risk for diabetes, consider being tested at a younger age or more often. Preventing infection Hepatitis B  If you have a higher risk for hepatitis B, you should be screened for this virus. You are considered at high risk for hepatitis B if: ? You were born in a country where hepatitis B is common. Ask your health care provider which countries are considered high risk. ? Your parents were born in a high-risk country, and you have not been immunized against hepatitis B (hepatitis B vaccine). ? You have HIV or AIDS. ? You use needles to inject street drugs. ? You live with someone who has hepatitis B. ? You have had sex with someone who has hepatitis B. ? You get hemodialysis treatment. ? You take certain medicines for conditions, including cancer, organ transplantation, and autoimmune conditions.  Hepatitis C  Blood testing is recommended for: ? Everyone born from [birth year] through 1964. ? Anyone with known risk factors for hepatitis C.  Sexually transmitted infections (STIs)  You should be screened for sexually transmitted infections (STIs) including gonorrhea and chlamydia if: ? You are sexually active and are younger than 42 years of age. ? You are older than 41 years of age and your health care provider tells you that you are at risk for this type of infection. ? Your sexual activity has changed since you were last screened and you are at an increased risk for chlamydia or gonorrhea. Ask your health care provider if you are at risk.  If you do not have HIV, but are at risk, it may be recommended that you take a prescription medicine daily to prevent HIV infection. This is called pre-exposure prophylaxis (PrEP). You are considered at risk  if: ? You are sexually active and do not regularly use condoms or know the HIV status of your partner(s). ? You take drugs by injection. ? You are sexually active with a partner who has HIV.  Talk with your health care provider about whether you are at high risk of being infected with HIV. If you choose to begin PrEP, you should first be tested for HIV. You should then be tested every 3 months  for as long as you are taking PrEP. Pregnancy  If you are premenopausal and you may become pregnant, ask your health care provider about preconception counseling.  If you may become pregnant, take 400 to 800 micrograms (mcg) of folic acid every day.  If you want to prevent pregnancy, talk to your health care provider about birth control (contraception). Osteoporosis and menopause  Osteoporosis is a disease in which the bones lose minerals and strength with aging. This can result in serious bone fractures. Your risk for osteoporosis can be identified using a bone density scan.  If you are 65 years of age or older, or if you are at risk for osteoporosis and fractures, ask your health care provider if you should be screened.  Ask your health care provider whether you should take a calcium or vitamin D supplement to lower your risk for osteoporosis.  Menopause may have certain physical symptoms and risks.  Hormone replacement therapy may reduce some of these symptoms and risks. Talk to your health care provider about whether hormone replacement therapy is right for you. Follow these instructions at home:  Schedule regular health, dental, and eye exams.  Stay current with your immunizations.  Do not use any tobacco products including cigarettes, chewing tobacco, or electronic cigarettes.  If you are pregnant, do not drink alcohol.  If you are breastfeeding, limit how much and how often you drink alcohol.  Limit alcohol intake to no more than 1 drink per day for nonpregnant women. One drink  equals 12 ounces of beer, 5 ounces of wine, or 1 ounces of hard liquor.  Do not use street drugs.  Do not share needles.  Ask your health care provider for help if you need support or information about quitting drugs.  Tell your health care provider if you often feel depressed.  Tell your health care provider if you have ever been abused or do not feel safe at home. This information is not intended to replace advice given to you by your health care provider. Make sure you discuss any questions you have with your health care provider. Document Released: 04/23/2011 Document Revised: 03/15/2016 Document Reviewed: 07/12/2015 Elsevier Interactive Patient Education  2018 [employer] Inc.  

## 2018-04-30 NOTE — Progress Notes (Signed)
BP 116/68 (BP Location: Left Arm, Patient Position: Sitting, Cuff Size: Normal)   Pulse 75   Temp 98.5 F (36.9 C) (Oral)   Ht 5\' 6"  (1.676 m)   Wt 158 lb 12 oz (72 kg)   SpO2 97%   BMI 25.62 kg/m    CC: CPE Subjective:    Patient ID: Sarah Mcdaniel, female    DOB: June 09, 1976, 42 y.o.   MRN: [id number]  HPI: SIDNEY SILBERMAN is a 42 y.o. female presenting on 04/30/2018 for Annual Exam   See prior note for details. Dizziness from last month thought phentermine related - labwork returned normal.  Mom just had 2v CABG last week.   Preventative: Well woman upcoming - Dr Corinna Capra at [facility]. Normal pap smears recently. Mirena in place. LMP not regular on mirena Mammogram normal with Dr Corinna Capra Flu shot - done Td 1997, Tdap at work  Has had hep B shots Will check with work on measles status Seat belt use discussed Sunscreen use discussed. No changing moles on skin, sees derm yearly Smoking - none Alcohol - none Dentist: Q6 mo  Eye exam - does not see.   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 Interim medical history since our last visit reviewed. Allergies and medications reviewed and updated. Outpatient Medications Prior to Visit  Medication Sig Dispense Refill  . levonorgestrel (MIRENA) 20 MCG/24HR IUD 1 each by Intrauterine route once.    . NON FORMULARY dotera essential oil supplement pack     No facility-administered medications prior to visit.      Per HPI unless specifically indicated in ROS section below Review of Systems     Objective:    BP 116/68 (BP Location: Left Arm, Patient Position: Sitting, Cuff Size: Normal)   Pulse 75   Temp 98.5 F (36.9 C) (Oral)   Ht 5\' 6"  (1.676 m)   Wt 158 lb 12 oz (72 kg)   SpO2 97%    BMI 25.62 kg/m   Wt Readings from Last 3 Encounters:  04/30/18 158 lb 12 oz (72 kg)  04/04/18 155 lb 8 oz (70.5 kg)  04/02/17 164 lb 12 oz (74.7 kg)    Physical Exam Results for orders placed or performed in visit on 04/04/18  TSH  Result Value Ref Range   TSH 1.15 0.35 - 4.50 uIU/mL  CBC with Differential/Platelet  Result Value Ref Range   WBC 5.8 4.0 - 10.5 K/uL   RBC 4.82 3.87 - 5.11 Mil/uL   Hemoglobin 13.9 12.0 - 15.0 g/dL   HCT 41.0 36.0 - 46.0 %   MCV 85.1 78.0 - 100.0 fl   MCHC 33.8 30.0 - 36.0 g/dL   RDW 12.6 11.5 - 15.5 %   Platelets 263.0 150.0 - 400.0 K/uL   Neutrophils Relative % 51.3 43.0 - 77.0 %   Lymphocytes Relative 37.7 12.0 - 46.0 %   Monocytes Relative 6.7 3.0 - 12.0 %   Eosinophils Relative 3.1 0.0 - 5.0 %   Basophils Relative 1.2 0.0 - 3.0 %   Neutro Abs 3.0 1.4 - 7.7 K/uL   Lymphs Abs 2.2 0.7 - 4.0 K/uL   Monocytes Absolute 0.4 0.1 - 1.0 K/uL   Eosinophils Absolute 0.2 0.0 - 0.7 K/uL   Basophils  Absolute 0.1 0.0 - 0.1 K/uL  Basic metabolic panel  Result Value Ref Range   Sodium 141 135 - 145 mEq/L   Potassium 3.7 3.5 - 5.1 mEq/L   Chloride 104 96 - 112 mEq/L   CO2 29 19 - 32 mEq/L   Glucose, Bld 89 70 - 99 mg/dL   BUN 7 6 - 23 mg/dL   Creatinine, Ser 0.63 0.40 - 1.20 mg/dL   Calcium 9.6 8.4 - 10.5 mg/dL   GFR 110.82 >60.00 mL/min  Lipid panel  Result Value Ref Range   Cholesterol 166 0 - 200 mg/dL   Triglycerides 95.0 0.0 - 149.0 mg/dL   HDL 49.80 >39.00 mg/dL   VLDL 19.0 0.0 - 40.0 mg/dL   LDL Cholesterol 98 0 - 99 mg/dL   Total CHOL/HDL Ratio 3    NonHDL 116.69       Assessment & Plan:   Problem List Items Addressed This Visit    None       No orders of the defined types were placed in this encounter.  No orders of the defined types were placed in this encounter.   Follow up plan: No follow-ups on file.  Ria Bush, MD

## 2018-04-30 NOTE — Assessment & Plan Note (Signed)
Preventative protocols reviewed and updated unless pt declined. Discussed healthy diet and lifestyle.  

## 2019-05-21 DIAGNOSIS — Z01419 Encounter for gynecological examination (general) (routine) without abnormal findings: Secondary | ICD-10-CM | POA: Diagnosis not present

## 2019-05-21 DIAGNOSIS — Z1231 Encounter for screening mammogram for malignant neoplasm of breast: Secondary | ICD-10-CM | POA: Diagnosis not present

## 2019-05-21 DIAGNOSIS — Z6827 Body mass index (BMI) 27.0-27.9, adult: Secondary | ICD-10-CM | POA: Diagnosis not present

## 2019-06-26 ENCOUNTER — Ambulatory Visit: Payer: Self-pay

## 2019-06-26 NOTE — Telephone Encounter (Signed)
Incoming call from Patient who complains of abdominal Pain located lower abd.  Near uterus.  Onset was yesterd Reports diarrhea also, sudden onset . Comes after Patient eats.  Feels pressure around anus.     2 slools today  2 to 3  Stools yesterday.  Rates Pain moderate to severe.  Appears to be a stabbing  Sensations  At times .  Has tried antacid with no help.  Reviewed protocol which recommend ED or Urgent Care.  Patient voiced understanding.                 Reason for Disposition . [1] MODERATE pain (e.g., interferes with normal activities) AND [2] pain comes and goes (cramps) AND [3] present > 24 hours  (Exception: pain with Vomiting or Diarrhea - see that Guideline)  Answer Assessment - Initial Assessment Questions 1. LOCATION: "Where does it hurt?"      Lower  Uterus level 2. RADIATION: "Does the pain shoot anywhere else?" (e.g., chest, back)     Low abdominal  3. ONSET: "When did the pain begin?" (e.g., minutes, hours or days ago)      [place]. SUDDEN: "Gradual or sudden onset?"   sudden      5. PATTERN "Does the pain come and go, or is it constant?"    - If constant: "Is it getting better, staying the same, or worsening?"      (Note: Constant means the pain never goes away completely; most serious pain is constant and it progresses)     - If intermittent: "How long does it last?" "Do you have pain now?"     (Note: Intermittent means the pain goes away completely between bouts)     Comes after Pt. Eats Pressure at anus area6. SEVERITY: "How bad is the pain?"  (e.g., Scale 1-10; mild, moderate, or severe)   - MILD (1-3): doesn't interfere with normal activities, abdomen soft and not tender to touch    - MODERATE (4-7): interferes with normal activities or awakens from sleep, tender to touch    - SEVERE (8-10): excruciating pain, doubled over, unable to do any normal activities     Moderate to severe 7. RECURRENT SYMPTOM: "Have you ever had this type of abdominal pain before?" If  so, ask: "When was the last time?" and "What happened that time?"      *No Answer* 8. CAUSE: "What do you think is causing the abdominal pain?"     *No Answer* 9. RELIEVING/AGGRAVATING FACTORS: "What makes it better or worse?" (e.g., movement, antacids, bowel movement)     *No Answer* 10. OTHER SYMPTOMS: "Has there been any vomiting, diarrhea, constipation, or urine problems?"       diarrhea 11. PREGNANCY: "Is there any chance you are pregnant?" "When was your last menstrual period?"     IUD  Protocols used: ABDOMINAL PAIN - [facility]

## 2019-06-26 NOTE — Telephone Encounter (Addendum)
Noted will await [facility] eval.  plz call Tuesday for an update.

## 2019-06-26 NOTE — Telephone Encounter (Signed)
Noted  

## 2019-06-30 NOTE — Telephone Encounter (Signed)
Spoke with pt asking how she is doing.  Says she is no longer having abd pain.  She has changed diet by avoiding heavy greasy, spicy food.

## 2019-07-03 DIAGNOSIS — E663 Overweight: Secondary | ICD-10-CM | POA: Diagnosis not present

## 2019-07-03 DIAGNOSIS — Z3A25 25 weeks gestation of pregnancy: Secondary | ICD-10-CM | POA: Diagnosis not present

## 2019-09-30 DIAGNOSIS — Z6824 Body mass index (BMI) 24.0-24.9, adult: Secondary | ICD-10-CM | POA: Diagnosis not present

## 2019-09-30 DIAGNOSIS — E663 Overweight: Secondary | ICD-10-CM | POA: Diagnosis not present

## 2020-01-27 ENCOUNTER — Telehealth: Payer: Self-pay | Admitting: *Deleted

## 2020-01-27 DIAGNOSIS — S66802A Unspecified injury of other specified muscles, fascia and tendons at wrist and hand level, left hand, initial encounter: Secondary | ICD-10-CM | POA: Diagnosis not present

## 2020-01-27 NOTE — Telephone Encounter (Signed)
Patient called stating that she is having a problem with her index finger on her left hand. Patient stated that for the last couple of weeks it has been popping out of joint. Patient stated that she is usually able to pop it back in place, but is not able to do that today. Patient stated that she is not sure if she should see Dr. Danise Mina or an orthopedist.  Offered patient an appointment tomorrow because we do not have any appointments left today. Patient declined stating that she will go to [facility] today to be seen. Patient stated that she thinks that she has arthritis and finger is painful. Advised patient if she does not have any luck with [facility] to call back and we can see her tomorrow and she verbalized understanding.

## 2020-01-27 NOTE — Telephone Encounter (Signed)
Noted  

## 2020-01-27 NOTE — Telephone Encounter (Signed)
Agree with eval today. Hopefully she is able to get in to see [facility] today. plz call tomorrow for update on index finger

## 2020-01-28 DIAGNOSIS — M65322 Trigger finger, left index finger: Secondary | ICD-10-CM | POA: Diagnosis not present

## 2020-01-28 NOTE — Telephone Encounter (Signed)
Spoke with pt for an update.  States she is currently with the hand specialist being treated for trigger finger.

## 2020-02-23 DIAGNOSIS — M65322 Trigger finger, left index finger: Secondary | ICD-10-CM | POA: Diagnosis not present

## 2020-02-23 DIAGNOSIS — S63651D Sprain of metacarpophalangeal joint of left index finger, subsequent encounter: Secondary | ICD-10-CM | POA: Diagnosis not present

## 2020-03-04 DIAGNOSIS — M65322 Trigger finger, left index finger: Secondary | ICD-10-CM | POA: Diagnosis not present

## 2020-07-18 DIAGNOSIS — Z01419 Encounter for gynecological examination (general) (routine) without abnormal findings: Secondary | ICD-10-CM | POA: Diagnosis not present

## 2020-07-18 DIAGNOSIS — Z1231 Encounter for screening mammogram for malignant neoplasm of breast: Secondary | ICD-10-CM | POA: Diagnosis not present

## 2020-07-18 DIAGNOSIS — Z6825 Body mass index (BMI) 25.0-25.9, adult: Secondary | ICD-10-CM | POA: Diagnosis not present

## 2020-09-01 DIAGNOSIS — Z131 Encounter for screening for diabetes mellitus: Secondary | ICD-10-CM | POA: Diagnosis not present

## 2020-09-01 DIAGNOSIS — Z832 Family history of diseases of the blood and blood-forming organs and certain disorders involving the immune mechanism: Secondary | ICD-10-CM | POA: Diagnosis not present

## 2020-09-01 DIAGNOSIS — Z1321 Encounter for screening for nutritional disorder: Secondary | ICD-10-CM | POA: Diagnosis not present

## 2020-09-01 DIAGNOSIS — Z13228 Encounter for screening for other metabolic disorders: Secondary | ICD-10-CM | POA: Diagnosis not present

## 2020-09-01 DIAGNOSIS — Z1329 Encounter for screening for other suspected endocrine disorder: Secondary | ICD-10-CM | POA: Diagnosis not present

## 2020-09-25 DIAGNOSIS — Z20822 Contact with and (suspected) exposure to covid-19: Secondary | ICD-10-CM | POA: Diagnosis not present

## 2020-10-03 DIAGNOSIS — Z20822 Contact with and (suspected) exposure to covid-19: Secondary | ICD-10-CM | POA: Diagnosis not present

## 2020-10-10 DIAGNOSIS — Z20822 Contact with and (suspected) exposure to covid-19: Secondary | ICD-10-CM | POA: Diagnosis not present

## 2020-10-24 DIAGNOSIS — Z20822 Contact with and (suspected) exposure to covid-19: Secondary | ICD-10-CM | POA: Diagnosis not present

## 2021-01-04 DIAGNOSIS — M545 Low back pain, unspecified: Secondary | ICD-10-CM | POA: Diagnosis not present

## 2021-01-04 DIAGNOSIS — M9905 Segmental and somatic dysfunction of pelvic region: Secondary | ICD-10-CM | POA: Diagnosis not present

## 2021-01-04 DIAGNOSIS — M9901 Segmental and somatic dysfunction of cervical region: Secondary | ICD-10-CM | POA: Diagnosis not present

## 2021-01-04 DIAGNOSIS — M9903 Segmental and somatic dysfunction of lumbar region: Secondary | ICD-10-CM | POA: Diagnosis not present

## 2021-01-11 DIAGNOSIS — M9905 Segmental and somatic dysfunction of pelvic region: Secondary | ICD-10-CM | POA: Diagnosis not present

## 2021-01-11 DIAGNOSIS — M9901 Segmental and somatic dysfunction of cervical region: Secondary | ICD-10-CM | POA: Diagnosis not present

## 2021-01-11 DIAGNOSIS — M545 Low back pain, unspecified: Secondary | ICD-10-CM | POA: Diagnosis not present

## 2021-01-11 DIAGNOSIS — M9903 Segmental and somatic dysfunction of lumbar region: Secondary | ICD-10-CM | POA: Diagnosis not present

## 2021-02-10 DIAGNOSIS — R17 Unspecified jaundice: Secondary | ICD-10-CM | POA: Diagnosis not present

## 2021-08-22 DIAGNOSIS — Z01419 Encounter for gynecological examination (general) (routine) without abnormal findings: Secondary | ICD-10-CM | POA: Diagnosis not present

## 2021-08-22 DIAGNOSIS — Z6826 Body mass index (BMI) 26.0-26.9, adult: Secondary | ICD-10-CM | POA: Diagnosis not present

## 2021-08-22 DIAGNOSIS — Z1231 Encounter for screening mammogram for malignant neoplasm of breast: Secondary | ICD-10-CM | POA: Diagnosis not present

## 2021-11-23 DIAGNOSIS — Z30433 Encounter for removal and reinsertion of intrauterine contraceptive device: Secondary | ICD-10-CM | POA: Diagnosis not present

## 2021-11-23 DIAGNOSIS — Z3202 Encounter for pregnancy test, result negative: Secondary | ICD-10-CM | POA: Diagnosis not present

## 2022-01-11 DIAGNOSIS — Z30431 Encounter for routine checking of intrauterine contraceptive device: Secondary | ICD-10-CM | POA: Diagnosis not present

## 2022-05-15 DIAGNOSIS — L710 Perioral dermatitis: Secondary | ICD-10-CM | POA: Diagnosis not present

## 2022-09-27 ENCOUNTER — Telehealth: Payer: Self-pay | Admitting: Family Medicine

## 2022-09-27 ENCOUNTER — Other Ambulatory Visit: Payer: Self-pay

## 2022-09-27 ENCOUNTER — Emergency Department
Admission: EM | Admit: 2022-09-27 | Discharge: 2022-09-27 | Disposition: A | Discharge status: Home / Self Care | Payer: 59 | Attending: Emergency Medicine | Admitting: Emergency Medicine

## 2022-09-27 ENCOUNTER — Encounter: Payer: Self-pay | Admitting: Emergency Medicine

## 2022-09-27 ENCOUNTER — Emergency Department: Payer: 59

## 2022-09-27 DIAGNOSIS — B9789 Other viral agents as the cause of diseases classified elsewhere: Secondary | ICD-10-CM | POA: Diagnosis not present

## 2022-09-27 DIAGNOSIS — Z1152 Encounter for screening for COVID-19: Secondary | ICD-10-CM | POA: Diagnosis not present

## 2022-09-27 DIAGNOSIS — R002 Palpitations: Secondary | ICD-10-CM | POA: Diagnosis not present

## 2022-09-27 DIAGNOSIS — B349 Viral infection, unspecified: Secondary | ICD-10-CM | POA: Insufficient documentation

## 2022-09-27 DIAGNOSIS — J069 Acute upper respiratory infection, unspecified: Secondary | ICD-10-CM | POA: Diagnosis not present

## 2022-09-27 DIAGNOSIS — R0789 Other chest pain: Secondary | ICD-10-CM | POA: Diagnosis not present

## 2022-09-27 DIAGNOSIS — R079 Chest pain, unspecified: Secondary | ICD-10-CM | POA: Diagnosis not present

## 2022-09-27 LAB — CBC
HCT: 43.7 % (ref 36.0–46.0)
Hemoglobin: 14.1 g/dL (ref 12.0–15.0)
MCH: 28.6 pg (ref 26.0–34.0)
MCHC: 32.3 g/dL (ref 30.0–36.0)
MCV: 88.6 fL (ref 80.0–100.0)
Platelets: 238 10*3/uL (ref 150–400)
RBC: 4.93 MIL/uL (ref 3.87–5.11)
RDW: 11.9 % (ref 11.5–15.5)
WBC: 7.7 10*3/uL (ref 4.0–10.5)
nRBC: 0 % (ref 0.0–0.2)

## 2022-09-27 LAB — TROPONIN I (HIGH SENSITIVITY): Troponin I (High Sensitivity): <2 ng/L (ref <18)

## 2022-09-27 LAB — RESP PANEL BY RT-PCR (FLU A&B, COVID) ARPGX2
Influenza A by PCR: NEGATIVE
Influenza B by PCR: NEGATIVE
SARS Coronavirus 2 by RT PCR: NEGATIVE

## 2022-09-27 LAB — BASIC METABOLIC PANEL
Anion gap: 8 (ref 5–15)
BUN: 10 mg/dL (ref 6–20)
CO2: 26 mmol/L (ref 22–32)
Calcium: 9.2 mg/dL (ref 8.9–10.3)
Chloride: 103 mmol/L (ref 98–111)
Creatinine, Ser: 0.56 mg/dL (ref 0.44–1.00)
GFR, Estimated: >60 mL/min (ref >60)
Glucose, Bld: 104 mg/dL — ABNORMAL HIGH (ref 70–99)
Potassium: 3.9 mmol/L (ref 3.5–5.1)
Sodium: 137 mmol/L (ref 135–145)

## 2022-09-27 LAB — POC URINE PREG, ED: Preg Test, Ur: NEGATIVE

## 2022-09-27 MED ORDER — ALBUTEROL SULFATE HFA 108 (90 BASE) MCG/ACT IN AERS: 2.0000 | INHALATION_SPRAY | Freq: Four times a day (QID) | RESPIRATORY_TRACT | 2 refills | Status: DC | PRN

## 2022-09-27 MED ORDER — PREDNISONE 10 MG PO TABS: ORAL_TABLET | ORAL | 0 refills | Status: AC

## 2022-09-27 MED ORDER — PSEUDOEPH-BROMPHEN-DM 30-2-10 MG/5ML PO SYRP: 5.0000 mL | ORAL_SOLUTION | Freq: Four times a day (QID) | ORAL | 0 refills | Status: DC | PRN

## 2022-09-27 NOTE — Telephone Encounter (Signed)
Per chart review tab pt was seen at [facility] ED earlier this morning. Sending note to Dr Darnell Level and G pool.

## 2022-09-27 NOTE — Telephone Encounter (Signed)
[place] [facility] - Client TELEPHONE ADVICE RECORD [facility] Patient Name: Sarah Mcdaniel Gender: Female DOB: 1976-10-10 Age: 46 Y 2 M 23 D Return Phone Number: [phone] (Primary) Address: City/ State/ Zip: [place]  [address] Client [facility] - Client Client Site [facility] Provider Ria Bush - MD Contact Type Call Who Is Calling Patient / Member / Family / Caregiver Call Type Triage / Clinical Relationship To Patient Self Return Phone Number [phone] (Primary) Chief Complaint CHEST PAIN - pain, pressure, heaviness or tightness Reason for Call Symptomatic / Request for Health Information Initial Comment Patient is experiencing irregular heart rate along with a head cold and a chest pain. Translation No Nurse Assessment Nurse: Thad Ranger, RN, Denise Date/Time (Eastern Time): 09/27/2022 8:40:53 AM Confirm and document reason for call. If symptomatic, describe symptoms. ---Patient is experiencing irregular heart rate along with a head cold and a chest pain. Fast HR woke her up this am. Does the patient have any new or worsening symptoms? ---Yes Will a triage be completed? ---Yes Related visit to physician within the last 2 weeks? ---No Does the PT have any chronic conditions? (i.e. diabetes, asthma, this includes High risk factors for pregnancy, etc.) ---No Is the patient pregnant or possibly pregnant? (Ask all females between the ages of 32-55) ---No Is this a behavioral health or substance abuse call? ---No Guidelines Guideline Title Affirmed Question Affirmed Notes Nurse Date/Time (Eastern Time) Heart Rate and Heartbeat Questions Dizziness, lightheadedness, or weakness Carmon, RN, Denise 09/27/2022 8:41:53 AM Disp. Time Eilene Ghazi Time) Disposition Final User 09/27/2022 8:35:13 AM Send to Urgent Clarnce Flock 09/27/2022 8:44:15 AM Go to ED Now Yes Carmon,  RN, Langley Gauss PLEASE NOTE: All timestamps contained within this report are represented as [place] Standard Time. CONFIDENTIALTY NOTICE: This fax transmission is intended only for the addressee. It contains information that is legally privileged, confidential or otherwise protected from use or disclosure. If you are not the intended recipient, you are strictly prohibited from reviewing, disclosing, copying using or disseminating any of this information or taking any action in reliance on or regarding this information. If you have received this fax in error, please notify us immediately by telephone so that we can arrange for its return to [place]. Phone: [phone], Toll-Free: [phone], Fax: [phone] Page: 2 of 2 Call Id: [id number] Final Disposition 09/27/2022 8:44:15 AM Go to ED Now Yes Thad Ranger, RN, Yevette Edwards Disagree/Comply Comply Caller Understands Yes PreDisposition Call Doctor Care Advice Given Per Guideline GO TO ED NOW: NOTE TO TRIAGER - DRIVING: * Another adult should drive. ANOTHER ADULT SHOULD DRIVE: * It is better and safer if another adult drives instead of you. BRING MEDICINES: * Bring a list of your current medicines when you go to the Emergency Department (ER). CARE ADVICE given per Heart Rate and Heartbeat Questions (Adult) guideline. Referrals [facility]

## 2022-09-27 NOTE — ED Triage Notes (Signed)
Pt via POV from home. Pt c/o L sided intermittent CP that radiates to her R side states that its been going on since Saturday. States that she also has been feeling sick on her stomach. Denies vomiting. Denies cardiac hx. Pt is A&OX4 and NAD

## 2022-09-27 NOTE — Telephone Encounter (Signed)
Patient called and stated that her heart has been racing and it woke her up in the middle of the night and also she stated she has a head cold. Patient was sent to access nurse.

## 2022-09-27 NOTE — ED Provider Notes (Signed)
[facility] Provider Note    Event Date/Time   First MD Initiated Contact with Patient 09/27/22 1021     (approximate)   History   Chief Complaint Chest Pain   HPI Sarah Mcdaniel is a 46 y.o. female, history of depression, presents to the emergency department for evaluation of chest pain.  She states that she has been getting intermittent episodes of palpitations over the past few days.  She has additionally been having some cough/congestion as well.  Reports some tightness in her chest.  She states that she has been wearing her electronic watch, which shows that her heart rate has been between 90 and 110 over the past few days.  She is very concerned about this.  Denies fevers or chills, abdominal pain, shortness of breath, flank pain, nausea/vomiting, diarrhea, dysuria, weakness, rash/lesions, vision change, hearing change, or dizziness/lightheadedness.  History Limitations: No limitations.        Physical Exam  Triage Vital Signs: ED Triage Vitals  Enc Vitals Group     BP 09/27/22 1003 109/77     Pulse Rate 09/27/22 1003 (!) 111     Resp 09/27/22 1003 18     Temp 09/27/22 1003 98.4 F (36.9 C)     Temp Source 09/27/22 1003 Oral     SpO2 09/27/22 1003 100 %     Weight 09/27/22 1003 165 lb (74.8 kg)     Height 09/27/22 1003 '5\' 6"'$  (1.676 m)     Head Circumference --      Peak Flow --      Pain Score 09/27/22 1006 5     Pain Loc --      Pain Edu? --      Excl. in [place]? --     Most recent vital signs: Vitals:   09/27/22 1003  BP: 109/77  Pulse: (!) 111  Resp: 18  Temp: 98.4 F (36.9 C)  SpO2: 100%    General: Awake, audible cough and congestion in the room.  Patient appears fatigued. Skin: Warm, dry. No rashes or lesions.  Eyes: PERRL. Conjunctivae normal.  CV: Good peripheral perfusion.  S1 and S2 present.  No murmurs, rubs, or gallops.  No chest wall tenderness. Resp: Normal effort.  Lung sounds are clear bilaterally in the apices  and bases. Abd: Soft, non-tender. No distention.  Neuro: At baseline. No gross neurological deficits.  Musculoskeletal: Normal ROM of all extremities.  Focused Exam: No swelling in the upper or lower extremities.  Physical Exam    ED Results / Procedures / Treatments  Labs (all labs ordered are listed, but only abnormal results are displayed) Labs Reviewed  BASIC METABOLIC PANEL - Abnormal; Notable for the following components:      Result Value   Glucose, Bld 104 (*)    All other components within normal limits  RESP PANEL BY RT-PCR (FLU A&B, COVID) ARPGX2  CBC  POC URINE PREG, ED  TROPONIN I (HIGH SENSITIVITY)     EKG Sinus rhythm, rate of 98, no ST segment changes, normal QRS, no QT prolongation, no axis deviations.    RADIOLOGY  ED Provider Interpretation: I personally reviewed and interpreted this x-ray, no evidence of acute cardiopulmonary abnormalities.  DG Chest 2 View  Result Date: 09/27/2022 CLINICAL DATA:  Chest pain. EXAM: CHEST - 2 VIEW COMPARISON:  January 07, 2013. FINDINGS: The heart size and mediastinal contours are within normal limits. Both lungs are clear. The visualized skeletal structures are unremarkable. IMPRESSION:  No active cardiopulmonary disease. Electronically Signed   By: Marijo Conception M.D.   On: 09/27/2022 10:32    PROCEDURES:  Critical Care performed: N/A.  Procedures    MEDICATIONS ORDERED IN ED: Medications - No data to display   IMPRESSION / MDM / [place] / ED COURSE  I reviewed the triage vital signs and the nursing notes.                              Differential diagnosis includes, but is not limited to, COVID-19, influenza, viral URI, arrhythmia, ACS, myocarditis/pericarditis, community-acquired pneumonia, bronchitis.  ED Course Patient appears unwell, but clinically stable.  Tachycardic at 111, otherwise normal vitals.  She is currently asymptomatic at this time.  CBC shows no leukocytosis or  anemia.  BMP shows no electrolyte abnormalities or AKI.  Initial troponin less than 2.  Urine pregnancy negative.  Respiratory panel pending.  Assessment/Plan Patient presents with cough/congestion and intermittent episodes of chest tightness and palpitations.  Physical exam is unremarkable.  Her history and presentation is highly suggestive of a viral respiratory infection, likely bronchitis.  Fortunately, her EKG and troponin do not show any evidence of ischemia or infarction.  Unlikely any significant cardiac pathology.  She is PERC negative, very low suspicion for pulmonary embolism.  She appears well clinically.  Pending respiratory panel, though unlikely to change management at this time.  Will treat for viral respiratory infection with albuterol inhaler, antitussives, nasal decongestant, and short course of prednisone.  Antibiotics not indicated at this time.  Recommend that she follow-up with her primary care provider as needed.  Will discharge.  Considered admission for this patient, but with presentation and unremarkable workup, she is unlikely benefit from admission.  Provided the patient with anticipatory guidance, return precautions, and educational material. Encouraged the patient to return to the emergency department at any time if they begin to experience any new or worsening symptoms. Patient expressed understanding and agreed with the plan.   Patient's presentation is most consistent with acute complicated illness / injury requiring diagnostic workup.       FINAL CLINICAL IMPRESSION(S) / ED DIAGNOSES   Final diagnoses:  Viral respiratory infection  Nonspecific chest pain     Rx / DC Orders   ED Discharge Orders          Ordered    predniSONE (DELTASONE) 10 MG tablet  Q breakfast        09/27/22 1120    albuterol (VENTOLIN HFA) 108 (90 Base) MCG/ACT inhaler  Every 6 hours PRN        09/27/22 1120    brompheniramine-pseudoephedrine-DM 30-2-10 MG/5ML syrup  4  times daily PRN        09/27/22 1120             Note:  This document was prepared using Dragon voice recognition software and may include unintentional dictation errors.   Teodoro Spray, [place] 09/27/22 1123    Harvest Dark, MD 09/27/22 1530

## 2022-09-27 NOTE — Discharge Instructions (Addendum)
-  I suspect the likely have a viral respiratory infection with some associated bronchitis.  Your symptoms will resolve on their own with time.  In the meantime, we will treat your symptoms.  Please take the full course of the prednisone as prescribed.  This will help with inflammation.   -You may utilize the albuterol inhaler for chest tightness and cough as needed.  -You may take the cough syrup as needed for sinus congestion, cough, and runny nose.  -Follow-up with the results of your COVID-19/influenza testing online.  If positive, please avoid contact with others for at least 5 days.  -Please follow-up with your primary care provider as needed.  -Return to the emergency department anytime if you begin to experience any new or worsening symptoms.

## 2022-09-28 NOTE — Telephone Encounter (Signed)
Noted. S/p reassuring ER eval treated for viral bronchitis with albuterol, prednisone taper, cough syrup.

## 2022-11-29 DIAGNOSIS — H524 Presbyopia: Secondary | ICD-10-CM | POA: Diagnosis not present

## 2023-01-23 DIAGNOSIS — Z01419 Encounter for gynecological examination (general) (routine) without abnormal findings: Secondary | ICD-10-CM | POA: Diagnosis not present

## 2023-01-23 DIAGNOSIS — Z1231 Encounter for screening mammogram for malignant neoplasm of breast: Secondary | ICD-10-CM | POA: Diagnosis not present

## 2023-02-06 ENCOUNTER — Encounter: Payer: Self-pay | Admitting: Internal Medicine

## 2023-02-19 ENCOUNTER — Encounter: Payer: Self-pay | Admitting: Internal Medicine

## 2023-02-19 ENCOUNTER — Ambulatory Visit (AMBULATORY_SURGERY_CENTER): Payer: Commercial Managed Care - PPO | Admitting: *Deleted

## 2023-02-19 VITALS — Ht 66.0 in | Wt 170.0 lb

## 2023-02-19 DIAGNOSIS — Z1211 Encounter for screening for malignant neoplasm of colon: Secondary | ICD-10-CM

## 2023-02-19 MED ORDER — NA SULFATE-K SULFATE-MG SULF 17.5-3.13-1.6 GM/177ML PO SOLN: 1.0000 | Freq: Once | ORAL | 0 refills | Status: AC

## 2023-02-19 NOTE — Progress Notes (Signed)
Pt's name and DOB verified at the beginning of the pre-visit.  Pt denies any difficulty with ambulating,sitting, laying down or rolling side to side Gave both [facility] main # and MD on call # prior to instructions.  No egg or soy allergy known to patient  Patient denies ever being intubated Pt has no issues moving head neck or swallowing No FH of Malignant Hyperthermia Pt is not on diet pills Pt is not on home 02  Pt is not on blood thinners  Pt has frequent issues with constipation RN instructed pt to use Miralax per bottles instructions a week before prep days. Pt states they will Pt is not on dialysis Pt denies any upcoming cardiac testing Pt encouraged to use to use Singlecare or Goodrx to reduce cost  Patient's chart reviewed by Cathlyn Parsons CNRA prior to pre-visit and patient appropriate for the [facility].  Pre-visit completed and red dot placed by patient's name on their procedure day (on provider's schedule).  . Visit by phone Pt states weight is 170 lb Instructed pt why it is important to and  to call if they have any changes in health or new medications. Directed them to the # given and on instructions.   Pt states they will.  Instructions reviewed with pt and pt states understanding. Instructed to review again prior to procedure. Pt states they will.  Instructions sent by mail with coupon and by my chart

## 2023-02-25 DIAGNOSIS — M67911 Unspecified disorder of synovium and tendon, right shoulder: Secondary | ICD-10-CM | POA: Diagnosis not present

## 2023-02-26 ENCOUNTER — Telehealth: Payer: Self-pay | Admitting: Internal Medicine

## 2023-02-26 NOTE — Telephone Encounter (Signed)
Patient called stating that she is currently participating in a weight loss program and is wondering she should stop taking the powdered mix since it has vitamins in it. She also wanted to let us know that she received a Cortizone shot. Requesting a call back make sure everything is okay. Please advise, thank you.

## 2023-02-26 NOTE — Telephone Encounter (Signed)
OPTIVIA DIET,THEY HAVE MILK AND SOY IN IT  AND [facility] GI UPSET WAS UNABLE TO TELL ME ALL THE  INGREDIENTS,ENCOURAGED PT NOT TO TAKE AFTER 03/02/23 UNTIL AFTER PROCEDURE,SHE STSTED "I AM GOING TO BE OUT OF THEM ON SATURDAY AND I WILL BE DOING SOMETHING ELSE,THEN PT,WANTED TO KNOW ID SHE CAN DRINK SMOOTHIE INFORMED HER YES BUT TO AVOID THE STRAWBERRIES,VERBALIZED UNDERSTANDING.

## 2023-02-28 DIAGNOSIS — C44311 Basal cell carcinoma of skin of nose: Secondary | ICD-10-CM | POA: Diagnosis not present

## 2023-02-28 DIAGNOSIS — D485 Neoplasm of uncertain behavior of skin: Secondary | ICD-10-CM | POA: Diagnosis not present

## 2023-03-11 HISTORY — PX: BASAL CELL CARCINOMA EXCISION: SHX1214

## 2023-03-12 ENCOUNTER — Telehealth: Payer: Self-pay | Admitting: Internal Medicine

## 2023-03-12 NOTE — Telephone Encounter (Signed)
Returned pts call.  Advised her that she would be fine to have procedure tomorrow but encouraged her to have a bandage covering where the skin cancer was removed.

## 2023-03-12 NOTE — Telephone Encounter (Signed)
Patient calling states she had a skin cancer removal under her nose, she is able to take the bandage off tomorrow so her dermatologist said it will be fine but she wanted to check with [place] to make sure. Please advise

## 2023-03-13 ENCOUNTER — Ambulatory Visit (AMBULATORY_SURGERY_CENTER): Payer: Commercial Managed Care - PPO | Admitting: Internal Medicine

## 2023-03-13 ENCOUNTER — Encounter: Payer: Self-pay | Admitting: Internal Medicine

## 2023-03-13 VITALS — BP 104/71 | HR 69 | Temp 98.4°F | Resp 10 | Ht 66.0 in | Wt 170.0 lb

## 2023-03-13 DIAGNOSIS — D122 Benign neoplasm of ascending colon: Secondary | ICD-10-CM

## 2023-03-13 DIAGNOSIS — Z1211 Encounter for screening for malignant neoplasm of colon: Secondary | ICD-10-CM | POA: Diagnosis not present

## 2023-03-13 DIAGNOSIS — E669 Obesity, unspecified: Secondary | ICD-10-CM | POA: Diagnosis not present

## 2023-03-13 MED ORDER — SODIUM CHLORIDE 0.9 % IV SOLN: 500.0000 mL | INTRAVENOUS | Status: DC

## 2023-03-13 NOTE — Progress Notes (Signed)
Report to PACU, RN, vss, BBS= Clear.  

## 2023-03-13 NOTE — Progress Notes (Signed)
HISTORY OF PRESENT ILLNESS:  Sarah Mcdaniel is a 47 y.o. female who is sent today for routine colonoscopy.  No complaints  REVIEW OF SYSTEMS:  All non-GI ROS negative except for  Past Medical History:  Diagnosis Date   Depression    seasonal   GERD (gastroesophageal reflux disease)    History of depression    h/o seasonal affective disorder   History of HPV infection    resolved   Kidney stone    Nephrolithiasis 01/21/2015   referred to urology   Seasonal allergies    mild    Past Surgical History:  Procedure Laterality Date   BASAL CELL CARCINOMA EXCISION Right 03/11/2023   DOBUTAMINE STRESS ECHO  2014   normal    Social History Sarah Mcdaniel  reports that she quit smoking about 20 years ago. Her smoking use included cigarettes. She started smoking about 33 years ago. She has a 6.00 pack-year smoking history. She has never used smokeless tobacco. She reports current alcohol use. She reports that she does not use drugs.  family history includes Asthma in her maternal grandfather; CAD in an other family member; CAD (age of onset: 64) in her mother; Diabetes in her father and mother; Heart failure in her paternal grandfather; Hyperlipidemia in her mother; Hypertension in her mother; Stroke in her maternal grandmother.  No Known Allergies     PHYSICAL EXAMINATION: Vital signs: BP (!) 98/57   Pulse 72   Temp 98.4 F (36.9 C) (Skin)   Ht 5\' 6"  (1.676 m)   Wt 170 lb (77.1 kg)   SpO2 100%   BMI 27.44 kg/m  General: Well-developed, well-nourished, no acute distress HEENT: Sclerae are anicteric, conjunctiva pink. Oral mucosa intact Lungs: Clear Heart: Regular Abdomen: soft, nontender, nondistended, no obvious ascites, no peritoneal signs, normal bowel sounds. No organomegaly. Extremities: No edema Psychiatric: alert and oriented x3. Cooperative     ASSESSMENT:   Colon cancer screening  PLAN:  Screening colonoscopy

## 2023-03-13 NOTE — Op Note (Signed)
[facility] Patient Name: Sarah Mcdaniel Procedure Date: 03/13/2023 1:25 PM MRN: [id number] Endoscopist: Wilhemina Bonito. Marina Goodell , MD, [id number] Age: 47 Referring MD:  Date of Birth: 02/09/1976 Gender: Female Account #: [id number] Procedure:                Colonoscopy with cold snare polypectomy x 1 Indications:              Screening for colorectal malignant neoplasm Medicines:                Monitored Anesthesia Care Procedure:                Pre-Anesthesia Assessment:                           - Prior to the procedure, a History and Physical                            was performed, and patient medications and                            allergies were reviewed. The patient's tolerance of                            previous anesthesia was also reviewed. The risks                            and benefits of the procedure and the sedation                            options and risks were discussed with the patient.                            All questions were answered, and informed consent                            was obtained. Prior Anticoagulants: The patient has                            taken no anticoagulant or antiplatelet agents. ASA                            Grade Assessment: II - A patient with mild systemic                            disease. After reviewing the risks and benefits,                            the patient was deemed in satisfactory condition to                            undergo the procedure.                           After obtaining informed consent, the colonoscope  was passed under direct vision. Throughout the                            procedure, the patient's blood pressure, pulse, and                            oxygen saturations were monitored continuously. The                            Olympus CF-[id number] SN [id number] was introduced through                            the anus and advanced to the the cecum, identified                             by appendiceal orifice and ileocecal valve. The                            ileocecal valve, appendiceal orifice, and rectum                            were photographed. The quality of the bowel                            preparation was excellent. The colonoscopy was                            performed without difficulty. The patient tolerated                            the procedure well. The bowel preparation used was                            SUPREP via split dose instruction. Scope In: 1:44:02 PM Scope Out: 1:57:42 PM Scope Withdrawal Time: 0 hours 8 minutes 37 seconds  Total Procedure Duration: 0 hours 13 minutes 40 seconds  Findings:                 A 2 mm polyp was found in the ascending colon. The                            polyp was removed with a cold snare. Resection and                            retrieval were complete.                           The exam was otherwise without abnormality on                            direct and retroflexion views. Complications:            No immediate complications. Estimated blood loss:  None. Estimated Blood Loss:     Estimated blood loss: none. Impression:               - One 2 mm polyp in the ascending colon, removed                            with a cold snare. Resected and retrieved.                           - The examination was otherwise normal on direct                            and retroflexion views. Recommendation:           - Repeat colonoscopy in 7-10 years for surveillance.                           - Patient has a contact number available for                            emergencies. The signs and symptoms of potential                            delayed complications were discussed with the                            patient. Return to normal activities tomorrow.                            Written discharge instructions were provided to the                             patient.                           - Resume previous diet.                           - Continue present medications.                           - Await pathology results. Wilhemina Bonito. Marina Goodell, MD 03/13/2023 2:08:37 PM This report has been signed electronically.

## 2023-03-13 NOTE — Patient Instructions (Addendum)
Resume all of your current medications today.  Resume your regular diet.  YOU HAD AN ENDOSCOPIC PROCEDURE TODAY AT THE [facility]:   Refer to the procedure report that was given to you for any specific questions about what was found during the examination.  If the procedure report does not answer your questions, please call your gastroenterologist to clarify.  If you requested that your care partner not be given the details of your procedure findings, then the procedure report has been included in a sealed envelope for you to review at your convenience later.  YOU SHOULD EXPECT: Some feelings of bloating in the abdomen. Passage of more gas than usual.  Walking can help get rid of the air that was put into your GI tract during the procedure and reduce the bloating. If you had a lower endoscopy (such as a colonoscopy or flexible sigmoidoscopy) you may notice spotting of blood in your stool or on the toilet paper. If you underwent a bowel prep for your procedure, you may not have a normal bowel movement for a few days.  Please Note:  You might notice some irritation and congestion in your nose or some drainage.  This is from the oxygen used during your procedure.  There is no need for concern and it should clear up in a day or so.  SYMPTOMS TO REPORT IMMEDIATELY:  Following lower endoscopy (colonoscopy or flexible sigmoidoscopy):  Excessive amounts of blood in the stool  Significant tenderness or worsening of abdominal pains  Swelling of the abdomen that is new, acute  Fever of 100F or higher    For urgent or emergent issues, a gastroenterologist can be reached at any hour by calling [phone]. Do not use MyChart messaging for urgent concerns.    DIET:  We do recommend a small meal at first, but then you may proceed to your regular diet.  Drink plenty of fluids but you should avoid alcoholic beverages for 24 hours.  ACTIVITY:  You should plan to take it easy for the rest of  today and you should NOT DRIVE or use heavy machinery until tomorrow (because of the sedation medicines used during the test).    FOLLOW UP: Our staff will call the number listed on your records the next business day following your procedure.  We will call around 7:15- 8:00 am to check on you and address any questions or concerns that you may have regarding the information given to you following your procedure. If we do not reach you, we will leave a message.     If any biopsies were taken you will be contacted by phone or by letter within the next 1-3 weeks.  Please call us at [phone] if you have not heard about the biopsies in 3 weeks.    SIGNATURES/CONFIDENTIALITY: You and/or your care partner have signed paperwork which will be entered into your electronic medical record.  These signatures attest to the fact that that the information above on your After Visit Summary has been reviewed and is understood.  Full responsibility of the confidentiality of this discharge information lies with you and/or your care-partner.

## 2023-03-13 NOTE — Progress Notes (Signed)
Patient states there have been no changes to medical or surgical history since time of pre-visit. 

## 2023-03-13 NOTE — Progress Notes (Signed)
Called to room to assist during endoscopic procedure.  Patient ID and intended procedure confirmed with present staff. Received instructions for my participation in the procedure from the performing physician.  

## 2023-03-14 ENCOUNTER — Telehealth: Payer: Self-pay

## 2023-03-14 NOTE — Telephone Encounter (Signed)
  Follow up Call-     03/13/2023    1:02 PM  Call back number  Post procedure Call Back phone  # [phone]  Permission to leave phone message Yes     Patient questions:  Do you have a fever, pain , or abdominal swelling? No. Pain Score  0 *  Have you tolerated food without any problems? Yes.    Have you been able to return to your normal activities? Yes.    Do you have any questions about your discharge instructions: Diet   No. Medications  No. Follow up visit  No.  Do you have questions or concerns about your Care? No.  Actions: * If pain score is 4 or above: No action needed, pain <4.

## 2023-03-19 ENCOUNTER — Encounter: Payer: Self-pay | Admitting: Internal Medicine

## 2023-04-03 DIAGNOSIS — H40013 Open angle with borderline findings, low risk, bilateral: Secondary | ICD-10-CM | POA: Diagnosis not present

## 2023-06-18 DIAGNOSIS — L821 Other seborrheic keratosis: Secondary | ICD-10-CM | POA: Diagnosis not present

## 2023-06-18 DIAGNOSIS — D225 Melanocytic nevi of trunk: Secondary | ICD-10-CM | POA: Diagnosis not present

## 2023-06-18 DIAGNOSIS — Z85828 Personal history of other malignant neoplasm of skin: Secondary | ICD-10-CM | POA: Diagnosis not present

## 2023-06-18 DIAGNOSIS — L814 Other melanin hyperpigmentation: Secondary | ICD-10-CM | POA: Diagnosis not present

## 2023-06-18 DIAGNOSIS — D485 Neoplasm of uncertain behavior of skin: Secondary | ICD-10-CM | POA: Diagnosis not present

## 2023-06-18 DIAGNOSIS — D1801 Hemangioma of skin and subcutaneous tissue: Secondary | ICD-10-CM | POA: Diagnosis not present

## 2023-06-18 DIAGNOSIS — L739 Follicular disorder, unspecified: Secondary | ICD-10-CM | POA: Diagnosis not present

## 2024-03-04 DIAGNOSIS — Z6827 Body mass index (BMI) 27.0-27.9, adult: Secondary | ICD-10-CM | POA: Diagnosis not present

## 2024-03-04 DIAGNOSIS — Z124 Encounter for screening for malignant neoplasm of cervix: Secondary | ICD-10-CM | POA: Diagnosis not present

## 2024-03-04 DIAGNOSIS — Z01419 Encounter for gynecological examination (general) (routine) without abnormal findings: Secondary | ICD-10-CM | POA: Diagnosis not present

## 2024-04-12 ENCOUNTER — Emergency Department (HOSPITAL_COMMUNITY)

## 2024-04-12 ENCOUNTER — Emergency Department (HOSPITAL_COMMUNITY)
Admission: EM | Admit: 2024-04-12 | Discharge: 2024-04-12 | Disposition: A | Discharge status: Home / Self Care | Attending: Emergency Medicine | Admitting: Emergency Medicine

## 2024-04-12 ENCOUNTER — Encounter (HOSPITAL_COMMUNITY): Payer: Self-pay

## 2024-04-12 ENCOUNTER — Other Ambulatory Visit: Payer: Self-pay

## 2024-04-12 DIAGNOSIS — I959 Hypotension, unspecified: Secondary | ICD-10-CM | POA: Diagnosis not present

## 2024-04-12 DIAGNOSIS — R55 Syncope and collapse: Secondary | ICD-10-CM | POA: Diagnosis not present

## 2024-04-12 LAB — COMPREHENSIVE METABOLIC PANEL WITH GFR
ALT: 22 U/L (ref 0–44)
AST: 33 U/L (ref 15–41)
Albumin: 3.8 g/dL (ref 3.5–5.0)
Alkaline Phosphatase: 57 U/L (ref 38–126)
Anion gap: 17 — ABNORMAL HIGH (ref 5–15)
BUN: 13 mg/dL (ref 6–20)
CO2: 19 mmol/L — ABNORMAL LOW (ref 22–32)
Calcium: 9.4 mg/dL (ref 8.9–10.3)
Chloride: 100 mmol/L (ref 98–111)
Creatinine, Ser: 0.9 mg/dL (ref 0.44–1.00)
GFR, Estimated: >60 mL/min (ref >60)
Glucose, Bld: 148 mg/dL — ABNORMAL HIGH (ref 70–99)
Potassium: 3.9 mmol/L (ref 3.5–5.1)
Sodium: 136 mmol/L (ref 135–145)
Total Bilirubin: 2.1 mg/dL — ABNORMAL HIGH (ref 0.0–1.2)
Total Protein: 7.2 g/dL (ref 6.5–8.1)

## 2024-04-12 LAB — CBC WITH DIFFERENTIAL/PLATELET
Abs Immature Granulocytes: 0.02 10*3/uL (ref 0.00–0.07)
Basophils Absolute: 0.1 10*3/uL (ref 0.0–0.1)
Basophils Relative: 1 %
Eosinophils Absolute: 0.2 10*3/uL (ref 0.0–0.5)
Eosinophils Relative: 2 %
HCT: 45.7 % (ref 36.0–46.0)
Hemoglobin: 14.9 g/dL (ref 12.0–15.0)
Immature Granulocytes: 0 %
Lymphocytes Relative: 41 %
Lymphs Abs: 3.4 10*3/uL (ref 0.7–4.0)
MCH: 28.4 pg (ref 26.0–34.0)
MCHC: 32.6 g/dL (ref 30.0–36.0)
MCV: 87 fL (ref 80.0–100.0)
Monocytes Absolute: 0.6 10*3/uL (ref 0.1–1.0)
Monocytes Relative: 7 %
Neutro Abs: 4.2 10*3/uL (ref 1.7–7.7)
Neutrophils Relative %: 49 %
Platelets: 311 10*3/uL (ref 150–400)
RBC: 5.25 MIL/uL — ABNORMAL HIGH (ref 3.87–5.11)
RDW: 12.2 % (ref 11.5–15.5)
WBC: 8.4 10*3/uL (ref 4.0–10.5)
nRBC: 0 % (ref 0.0–0.2)

## 2024-04-12 LAB — TROPONIN I (HIGH SENSITIVITY): Troponin I (High Sensitivity): 3 ng/L (ref <18)

## 2024-04-12 LAB — CBG MONITORING, ED: Glucose-Capillary: 120 mg/dL — ABNORMAL HIGH (ref 70–99)

## 2024-04-12 LAB — D-DIMER, QUANTITATIVE: D-Dimer, Quant: <0.27 ug{FEU}/mL (ref 0.00–0.50)

## 2024-04-12 MED ORDER — SODIUM CHLORIDE 0.9% FLUSH: 3.0000 mL | Freq: Two times a day (BID) | INTRAVENOUS | Status: DC

## 2024-04-12 MED ORDER — SODIUM CHLORIDE 0.9 % IV BOLUS (SEPSIS): 1000.0000 mL | Freq: Once | INTRAVENOUS | Status: DC

## 2024-04-12 MED ORDER — SODIUM CHLORIDE 0.9 % IV BOLUS (SEPSIS)
1000.0000 mL | Freq: Once | INTRAVENOUS | Status: AC
  Administered 2024-04-12: 1000 mL via INTRAVENOUS

## 2024-04-12 MED ORDER — SODIUM CHLORIDE 0.9% FLUSH: 3.0000 mL | INTRAVENOUS | Status: DC | PRN

## 2024-04-12 MED ORDER — SODIUM CHLORIDE 0.9 % IV SOLN: 1000.0000 mL | INTRAVENOUS | Status: DC

## 2024-04-12 MED ORDER — SODIUM CHLORIDE 0.9 % IV SOLN
1000.0000 mL | INTRAVENOUS | Status: DC
  Administered 2024-04-12: 1000 mL via INTRAVENOUS

## 2024-04-12 NOTE — Discharge Instructions (Addendum)
 Follow up with your Physician for recheck.  Make sure that you are eating and drinking plenty of fluids before work.  Monitor you symptoms if you have worsening symptoms return for further evaluation.

## 2024-04-12 NOTE — ED Triage Notes (Signed)
 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

## 2024-04-12 NOTE — ED Provider Notes (Cosign Needed Addendum)
 [facility] Provider Note   CSN: [id number] Arrival date & time: 04/12/24  9291     Patient presents with: Near Syncope   Sarah Mcdaniel is a 48 y.o. female.   Patient reports that she was called into work early today.  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  Patient's coworkers helped her to the floor.  She did not fall.  Patient states that she felt nauseated.  Patient felt as if she was going to pass out.  Her coworkers elevated her legs and then brought her to the emergency department for evaluation.  Patient states she did not eat or drink before going to work but this is not unusual for her.  Patient denies any fever or chills she has not had any vomiting or diarrhea she is not having any abdominal pain.  Patient denies any chest pain.  She has not had any headache she does not have any weakness in her arms legs or face.  Patient has no difficulty with speech or hearing.  She feels like her mentation is normal.  The history is provided by the patient. No language interpreter was used.  Near Syncope Pertinent negatives include no chest pain, no abdominal pain, no headaches and no shortness of breath.       Prior to Admission medications   Medication Sig Start Date End Date Taking? Authorizing Provider  levonorgestrel  (MIRENA ) 20 MCG/24HR IUD 1 each by Intrauterine route once.    [provider]  Multiple Vitamin (MULTIVITAMIN WITH MINERALS) TABS tablet Take 1 tablet by mouth daily.    [provider]  NON FORMULARY dotera essential oil supplement pack    [provider]  zinc gluconate 50 MG tablet Take 25 mg by mouth daily.    [provider]    Allergies: Patient has no known allergies.    Review of Systems  Constitutional:  Negative for chills and fever.  HENT:  Negative for hearing loss.   Eyes:  Negative for visual disturbance.   Respiratory:  Negative for chest tightness and shortness of breath.   Cardiovascular:  Positive for near-syncope. Negative for chest pain.  Gastrointestinal:  Negative for abdominal pain, diarrhea and vomiting.  Genitourinary:  Negative for dysuria.  Neurological:  Negative for facial asymmetry and headaches.  Psychiatric/Behavioral:  The patient is not nervous/anxious.   All other systems reviewed and are negative.   Updated Vital Signs BP 102/68   Pulse 87   Temp 98.1 F (36.7 C)   Resp 14   SpO2 100%   Physical Exam Vitals and nursing note reviewed.  Constitutional:      Appearance: She is well-developed.  HENT:     Head: Normocephalic.     Right Ear: External ear normal.     Left Ear: External ear normal.     Nose: Nose normal.     Mouth/Throat:     Mouth: Mucous membranes are moist.   Cardiovascular:     Rate and Rhythm: Normal rate.  Pulmonary:     Effort: Pulmonary effort is normal.  Abdominal:     General: There is no distension.   Musculoskeletal:        General: Normal range of motion.     Cervical back: Normal range of motion.   Skin:    General: Skin is warm.   Neurological:     Mental Status: She is alert and oriented  to person, place, and time.     Motor: No weakness.     Gait: Gait normal.   Psychiatric:        Mood and Affect: Mood normal.     (all labs ordered are listed, but only abnormal results are displayed) Labs Reviewed  CBC WITH DIFFERENTIAL/PLATELET - Abnormal; Notable for the following components:      Result Value   RBC 5.25 (*)    All other components within normal limits  COMPREHENSIVE METABOLIC PANEL WITH GFR - Abnormal; Notable for the following components:   CO2 19 (*)    Glucose, Bld 148 (*)    Total Bilirubin 2.1 (*)    Anion gap 17 (*)    All other components within normal limits  CBG MONITORING, ED - Abnormal; Notable for the following components:   Glucose-Capillary 120 (*)    All other components within normal  limits  D-DIMER, QUANTITATIVE  TROPONIN I (HIGH SENSITIVITY)  TROPONIN I (HIGH SENSITIVITY)    EKG: EKG Interpretation Date/Time:  Sunday April 12 2024 07:11:14 EDT Ventricular Rate:  88 PR Interval:  143 QRS Duration:  81 QT Interval:  379 QTC Calculation: 459 R Axis:   78  Text Interpretation: Sinus rhythm Confirmed by Yolande Charleston [phone]) on 04/12/2024 7:29:36 AM  Radiology: No results found.   Procedures   Medications Ordered in the ED - No data to display                                  Medical Decision Making Patient reports she felt hot dizzy and almost passed out while working in the OR  Amount and/or Complexity of Data Reviewed Independent Historian:     Details: Patient is here with her manager who is supportive Labs: ordered. Decision-making details documented in ED Course.    Details: Labs ordered reviewed and interpreted D-dimer is negative, troponin is negative Radiology: ordered and independent interpretation performed. Decision-making details documented in ED Course.    Details: CT head no acute findings. ECG/medicine tests: ordered and independent interpretation performed. Decision-making details documented in ED Course.    Details: EKG shows normal sinus rhythm normal EKG  Risk Prescription drug management. Risk Details: I discussed symptoms with the patient.  Pt  advised her to eat and drink.  Before going to work.  She is advised to monitor her symptoms and follow-up with her physician this week.  Patient is advised to return to the emergency department if symptoms worsen or change.   RN reports at time of discharge patient was dizzy and her blood pressure was rechecked and noted to be low.  Patient given 2 L of IV fluids.  Patient is now able to ambulate without difficulty without weakness.  Patient is discharged in stable condition.     Final diagnoses:  Near syncope    ED Discharge Orders     None      An After Visit Summary was  printed and given to the patient.     Flint Sonny MARLA DEVONNA 04/12/24 1010    Yolande Charleston BROCKS, MD 04/12/24 [address], [place] 04/12/24 1253    Yolande Charleston BROCKS, MD 04/14/24 [phone]

## 2024-04-12 NOTE — ED Notes (Signed)
 Pt returned from CT

## 2024-04-12 NOTE — ED Notes (Signed)
 Patient wanted her husband called I called him Marcey [phone]

## 2024-04-12 NOTE — ED Notes (Signed)
 Patient states she was feeling better ate a peanut butter graham cracker and started feeling very weak and near syncopal , K. [place] PA aware
